# Patient Record
Sex: Female | Born: 1950 | Race: White | Hispanic: No | Marital: Married | Smoking: Former smoker
Health system: Southern US, Community
[De-identification: ages and names within clinical notes are randomized; demographics above are authoritative.]

## PROBLEM LIST (undated history)

## (undated) DIAGNOSIS — B1910 Unspecified viral hepatitis B without hepatic coma: Secondary | ICD-10-CM

## (undated) DIAGNOSIS — H019 Unspecified inflammation of eyelid: Secondary | ICD-10-CM

## (undated) DIAGNOSIS — Z8619 Personal history of other infectious and parasitic diseases: Secondary | ICD-10-CM

## (undated) DIAGNOSIS — E079 Disorder of thyroid, unspecified: Secondary | ICD-10-CM

## (undated) DIAGNOSIS — Z86010 Personal history of colonic polyps: Secondary | ICD-10-CM

## (undated) HISTORY — PX: BREAST BIOPSY: SHX20

## (undated) HISTORY — DX: Personal history of colonic polyps: Z86.010

## (undated) HISTORY — PX: BREAST SURGERY: SHX581

## (undated) HISTORY — PX: TONSILLECTOMY AND ADENOIDECTOMY: SHX28

## (undated) HISTORY — DX: Unspecified viral hepatitis B without hepatic coma: B19.10

## (undated) HISTORY — DX: Unspecified inflammation of eyelid: H01.9

## (undated) HISTORY — PX: COLONOSCOPY: SHX174

## (undated) HISTORY — PX: CHOLECYSTECTOMY: SHX55

## (undated) HISTORY — DX: Personal history of other infectious and parasitic diseases: Z86.19

## (undated) HISTORY — DX: Disorder of thyroid, unspecified: E07.9

---

## 2001-11-13 ENCOUNTER — Other Ambulatory Visit: Admission: RE | Admit: 2001-11-13 | Discharge: 2001-11-13 | Payer: Self-pay | Admitting: Obstetrics and Gynecology

## 2002-01-15 ENCOUNTER — Encounter: Payer: Self-pay | Admitting: Surgery

## 2002-01-15 ENCOUNTER — Encounter: Admission: RE | Admit: 2002-01-15 | Discharge: 2002-01-15 | Payer: Self-pay | Admitting: Surgery

## 2002-10-08 ENCOUNTER — Encounter: Payer: Self-pay | Admitting: Internal Medicine

## 2002-11-12 ENCOUNTER — Encounter: Payer: Self-pay | Admitting: Surgery

## 2002-11-17 ENCOUNTER — Encounter: Payer: Self-pay | Admitting: Surgery

## 2002-11-17 ENCOUNTER — Ambulatory Visit (HOSPITAL_COMMUNITY): Admission: RE | Admit: 2002-11-17 | Discharge: 2002-11-17 | Payer: Self-pay | Admitting: Surgery

## 2002-11-17 ENCOUNTER — Encounter (INDEPENDENT_AMBULATORY_CARE_PROVIDER_SITE_OTHER): Payer: Self-pay

## 2002-12-24 ENCOUNTER — Other Ambulatory Visit: Admission: RE | Admit: 2002-12-24 | Discharge: 2002-12-24 | Payer: Self-pay | Admitting: Obstetrics and Gynecology

## 2003-04-29 ENCOUNTER — Ambulatory Visit (HOSPITAL_COMMUNITY): Admission: RE | Admit: 2003-04-29 | Discharge: 2003-04-29 | Payer: Self-pay | Admitting: Obstetrics and Gynecology

## 2003-04-29 ENCOUNTER — Encounter (INDEPENDENT_AMBULATORY_CARE_PROVIDER_SITE_OTHER): Payer: Self-pay

## 2003-10-14 ENCOUNTER — Other Ambulatory Visit: Admission: RE | Admit: 2003-10-14 | Discharge: 2003-10-14 | Payer: Self-pay | Admitting: Obstetrics and Gynecology

## 2003-12-12 LAB — HM COLONOSCOPY: HM Colonoscopy: NORMAL

## 2004-04-13 ENCOUNTER — Other Ambulatory Visit: Admission: RE | Admit: 2004-04-13 | Discharge: 2004-04-13 | Payer: Self-pay | Admitting: Obstetrics and Gynecology

## 2004-07-20 ENCOUNTER — Other Ambulatory Visit: Admission: RE | Admit: 2004-07-20 | Discharge: 2004-07-20 | Payer: Self-pay | Admitting: Obstetrics and Gynecology

## 2005-02-01 ENCOUNTER — Other Ambulatory Visit: Admission: RE | Admit: 2005-02-01 | Discharge: 2005-02-01 | Payer: Self-pay | Admitting: Obstetrics and Gynecology

## 2005-06-21 ENCOUNTER — Ambulatory Visit: Payer: Self-pay | Admitting: Internal Medicine

## 2005-06-28 ENCOUNTER — Ambulatory Visit: Payer: Self-pay | Admitting: Internal Medicine

## 2005-07-12 ENCOUNTER — Ambulatory Visit: Payer: Self-pay | Admitting: Internal Medicine

## 2005-07-19 ENCOUNTER — Encounter: Payer: Self-pay | Admitting: Internal Medicine

## 2005-07-19 ENCOUNTER — Ambulatory Visit: Payer: Self-pay | Admitting: Internal Medicine

## 2005-07-26 ENCOUNTER — Other Ambulatory Visit: Admission: RE | Admit: 2005-07-26 | Discharge: 2005-07-26 | Payer: Self-pay | Admitting: Obstetrics and Gynecology

## 2006-01-31 ENCOUNTER — Other Ambulatory Visit: Admission: RE | Admit: 2006-01-31 | Discharge: 2006-01-31 | Payer: Self-pay | Admitting: Obstetrics and Gynecology

## 2007-09-11 LAB — CONVERTED CEMR LAB: Pap Smear: NORMAL

## 2008-04-09 ENCOUNTER — Ambulatory Visit: Payer: Self-pay | Admitting: Internal Medicine

## 2008-04-09 DIAGNOSIS — I8390 Asymptomatic varicose veins of unspecified lower extremity: Secondary | ICD-10-CM

## 2008-04-09 DIAGNOSIS — R030 Elevated blood-pressure reading, without diagnosis of hypertension: Secondary | ICD-10-CM | POA: Insufficient documentation

## 2008-04-22 ENCOUNTER — Encounter: Payer: Self-pay | Admitting: Internal Medicine

## 2008-04-22 ENCOUNTER — Encounter: Admission: RE | Admit: 2008-04-22 | Discharge: 2008-04-22 | Payer: Self-pay | Admitting: Internal Medicine

## 2008-05-06 ENCOUNTER — Encounter: Payer: Self-pay | Admitting: Internal Medicine

## 2008-07-15 ENCOUNTER — Ambulatory Visit: Payer: Self-pay | Admitting: Internal Medicine

## 2008-07-15 LAB — CONVERTED CEMR LAB
ALT: 46 units/L — ABNORMAL HIGH (ref 0–35)
AST: 34 units/L (ref 0–37)
Alkaline Phosphatase: 93 units/L (ref 39–117)
Basophils Relative: 1.2 % (ref 0.0–3.0)
Bilirubin Urine: NEGATIVE
CO2: 29 meq/L (ref 19–32)
Chloride: 102 meq/L (ref 96–112)
Creatinine, Ser: 0.8 mg/dL (ref 0.4–1.2)
Direct LDL: 157.1 mg/dL
Eosinophils Relative: 2.8 % (ref 0.0–5.0)
Glucose, Urine, Semiquant: NEGATIVE
HDL: 60.3 mg/dL (ref >39.0)
MCHC: 34.2 g/dL (ref 30.0–36.0)
MCV: 91.2 fL (ref 78.0–100.0)
Monocytes Relative: 9.5 % (ref 3.0–12.0)
Platelets: 238 10*3/uL (ref 150–400)
Potassium: 4.1 meq/L (ref 3.5–5.1)
RDW: 12.1 % (ref 11.5–14.6)
Sodium: 140 meq/L (ref 135–145)
TSH: 2.93 microintl units/mL (ref 0.35–5.50)
Total Bilirubin: 1 mg/dL (ref 0.3–1.2)
Total CHOL/HDL Ratio: 3.7
Urobilinogen, UA: 0.2

## 2008-07-29 ENCOUNTER — Ambulatory Visit: Payer: Self-pay | Admitting: Internal Medicine

## 2008-08-12 ENCOUNTER — Encounter: Payer: Self-pay | Admitting: Internal Medicine

## 2008-08-12 ENCOUNTER — Ambulatory Visit: Payer: Self-pay | Admitting: Family Medicine

## 2008-09-10 LAB — HM MAMMOGRAPHY

## 2008-12-11 DIAGNOSIS — E079 Disorder of thyroid, unspecified: Secondary | ICD-10-CM

## 2008-12-11 HISTORY — DX: Disorder of thyroid, unspecified: E07.9

## 2009-10-11 LAB — CONVERTED CEMR LAB: Pap Smear: NORMAL

## 2009-12-17 ENCOUNTER — Telehealth: Payer: Self-pay | Admitting: Internal Medicine

## 2009-12-22 ENCOUNTER — Ambulatory Visit: Payer: Self-pay | Admitting: Internal Medicine

## 2009-12-22 LAB — CONVERTED CEMR LAB
BUN: 9 mg/dL (ref 6–23)
Basophils Absolute: 0.1 10*3/uL (ref 0.0–0.1)
Basophils Relative: 0.9 % (ref 0.0–3.0)
Bilirubin Urine: NEGATIVE
CO2: 29 meq/L (ref 19–32)
Chloride: 108 meq/L (ref 96–112)
Cholesterol: 211 mg/dL — ABNORMAL HIGH (ref 0–200)
Creatinine, Ser: 0.8 mg/dL (ref 0.4–1.2)
Direct LDL: 124 mg/dL
Eosinophils Absolute: 0.1 10*3/uL (ref 0.0–0.7)
Eosinophils Relative: 2 % (ref 0.0–5.0)
Glucose, Bld: 109 mg/dL — ABNORMAL HIGH (ref 70–99)
Glucose, Urine, Semiquant: NEGATIVE
HCT: 45.3 % (ref 36.0–46.0)
Ketones, urine, test strip: NEGATIVE
Lymphocytes Relative: 19.7 % (ref 12.0–46.0)
Lymphs Abs: 1.2 10*3/uL (ref 0.7–4.0)
MCV: 93 fL (ref 78.0–100.0)
Neutrophils Relative %: 70.1 % (ref 43.0–77.0)
Nitrite: NEGATIVE
Platelets: 246 10*3/uL (ref 150.0–400.0)
Protein, U semiquant: NEGATIVE
RBC: 4.87 M/uL (ref 3.87–5.11)
Total Bilirubin: 0.8 mg/dL (ref 0.3–1.2)
Total CHOL/HDL Ratio: 3
VLDL: 15.2 mg/dL (ref 0.0–40.0)
Vit D, 25-Hydroxy: 33 ng/mL (ref 30–89)
WBC: 5.9 10*3/uL (ref 4.5–10.5)

## 2010-01-13 ENCOUNTER — Ambulatory Visit: Payer: Self-pay | Admitting: Internal Medicine

## 2010-02-02 ENCOUNTER — Encounter: Admission: RE | Admit: 2010-02-02 | Discharge: 2010-02-02 | Payer: Self-pay | Admitting: Internal Medicine

## 2010-02-16 ENCOUNTER — Encounter: Admission: RE | Admit: 2010-02-16 | Discharge: 2010-02-16 | Payer: Self-pay | Admitting: Diagnostic Radiology

## 2010-02-22 ENCOUNTER — Encounter: Admission: RE | Admit: 2010-02-22 | Discharge: 2010-02-22 | Payer: Self-pay | Admitting: Interventional Radiology

## 2010-03-29 ENCOUNTER — Encounter: Admission: RE | Admit: 2010-03-29 | Discharge: 2010-03-29 | Payer: Self-pay | Admitting: Diagnostic Radiology

## 2010-04-20 ENCOUNTER — Encounter: Admission: RE | Admit: 2010-04-20 | Discharge: 2010-04-20 | Payer: Self-pay | Admitting: Diagnostic Radiology

## 2010-07-01 ENCOUNTER — Encounter: Admission: RE | Admit: 2010-07-01 | Discharge: 2010-07-01 | Payer: Self-pay | Admitting: Internal Medicine

## 2010-07-01 ENCOUNTER — Encounter: Payer: Self-pay | Admitting: Internal Medicine

## 2010-07-11 ENCOUNTER — Encounter: Payer: Self-pay | Admitting: Internal Medicine

## 2010-07-15 ENCOUNTER — Encounter: Admission: RE | Admit: 2010-07-15 | Discharge: 2010-07-15 | Payer: Self-pay | Admitting: Internal Medicine

## 2010-07-15 ENCOUNTER — Encounter: Payer: Self-pay | Admitting: Internal Medicine

## 2010-07-28 ENCOUNTER — Ambulatory Visit: Payer: Self-pay | Admitting: Internal Medicine

## 2010-07-28 DIAGNOSIS — D487 Neoplasm of uncertain behavior of other specified sites: Secondary | ICD-10-CM

## 2010-08-06 IMAGING — US US MFM FOLLOW-UP FOCUS VISIT
1 series · 13 of 28 positions shown · non-contrast
Comparison: none

[Series 1: us mfm follow-up focus visit · 13 of 37 slices shown]
[im 2/37]
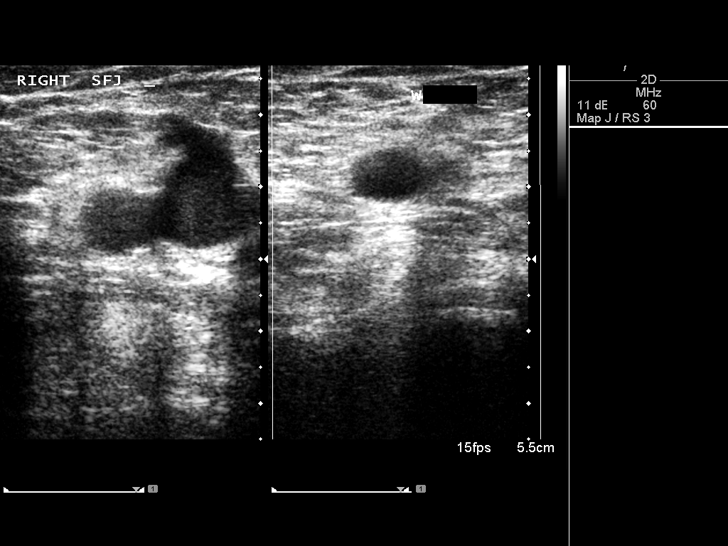
[im 5/37]
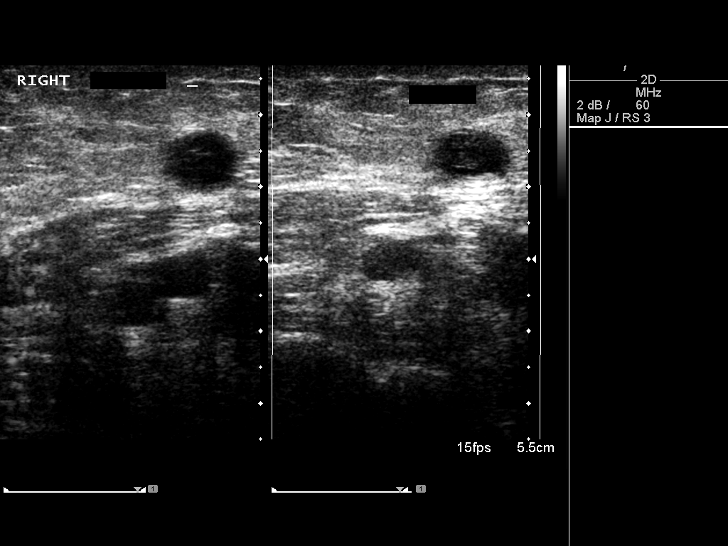
[im 7/37]
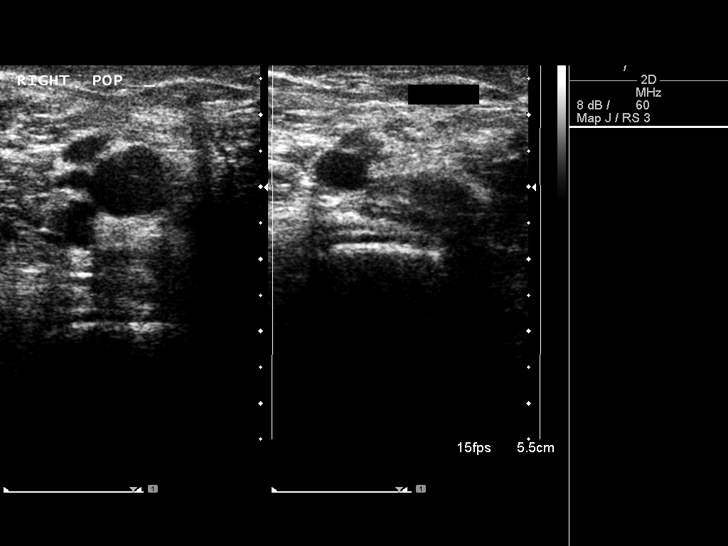
[im 10/37]
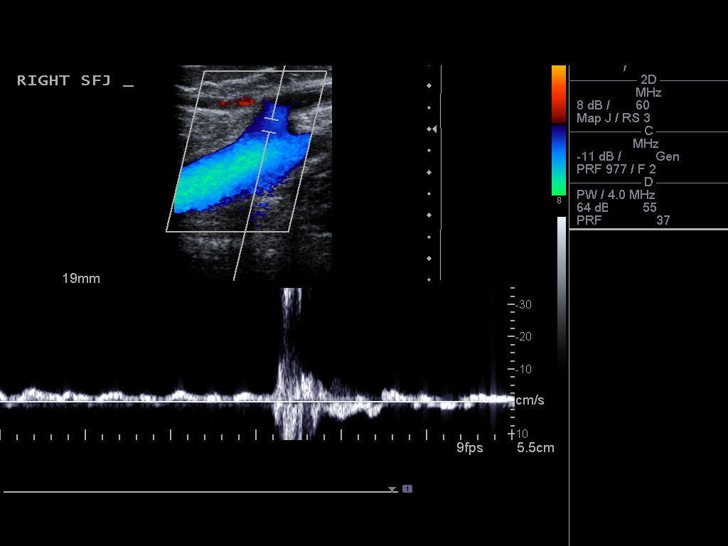
[im 13/37]
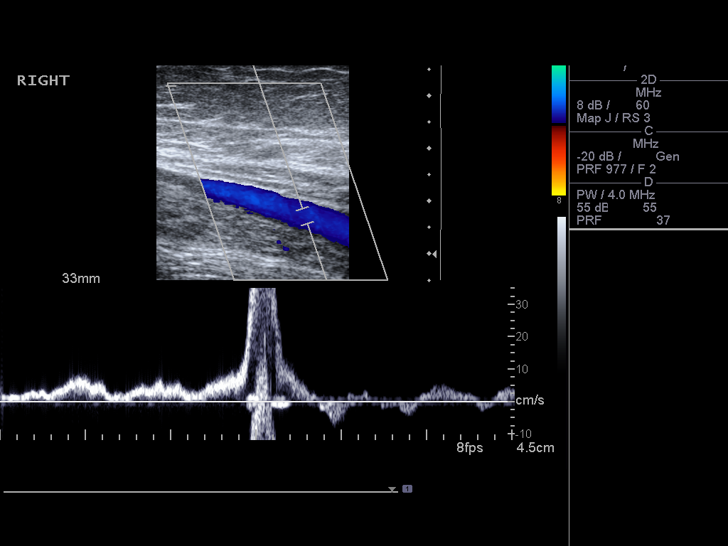
[im 15/37]
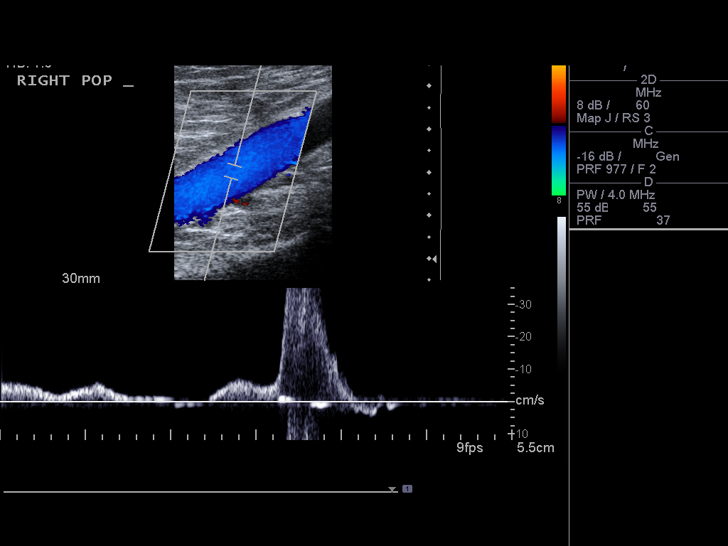
[im 19/37]
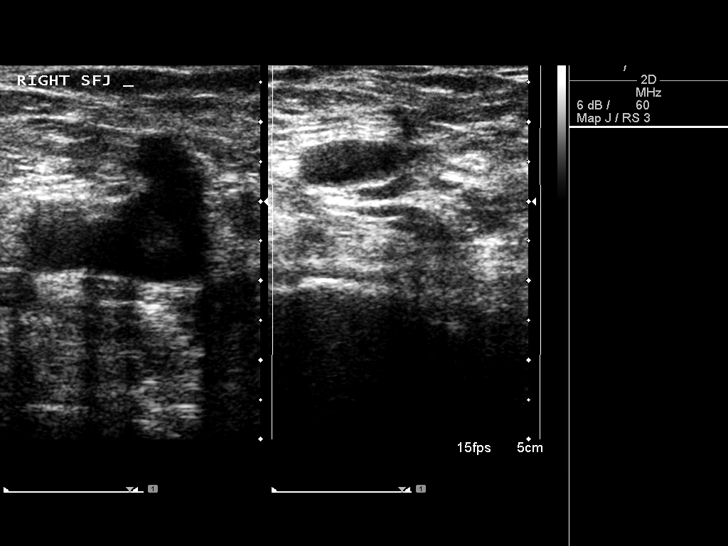
[im 22/37]
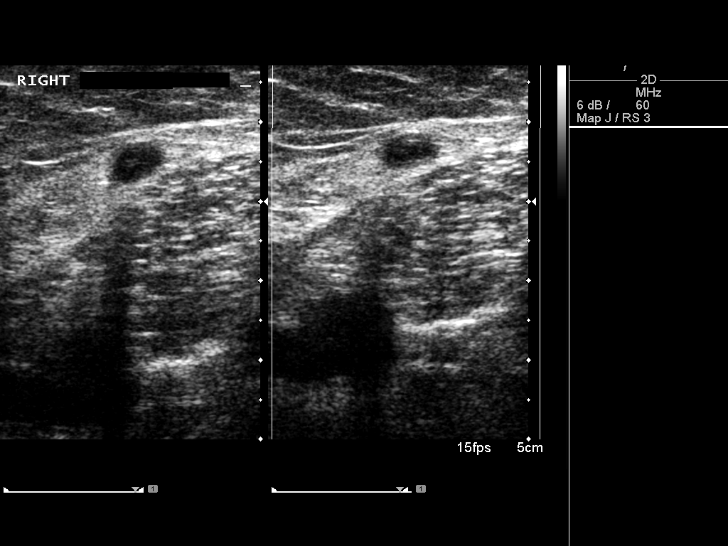
[im 25/37]
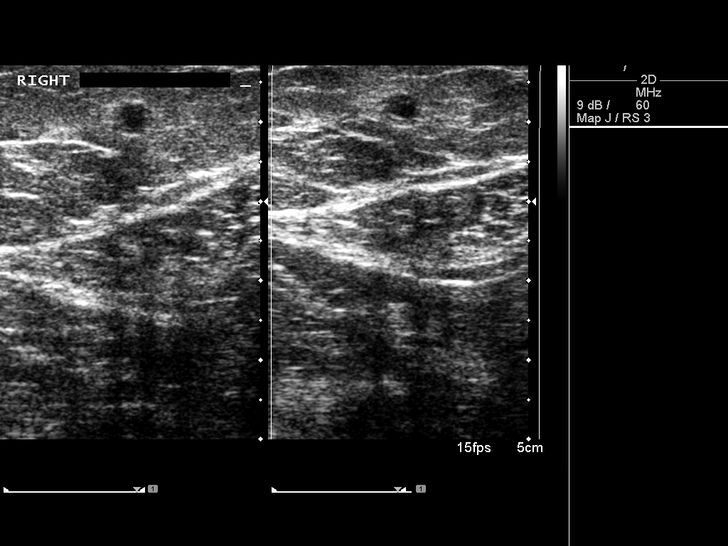
[im 27/37]
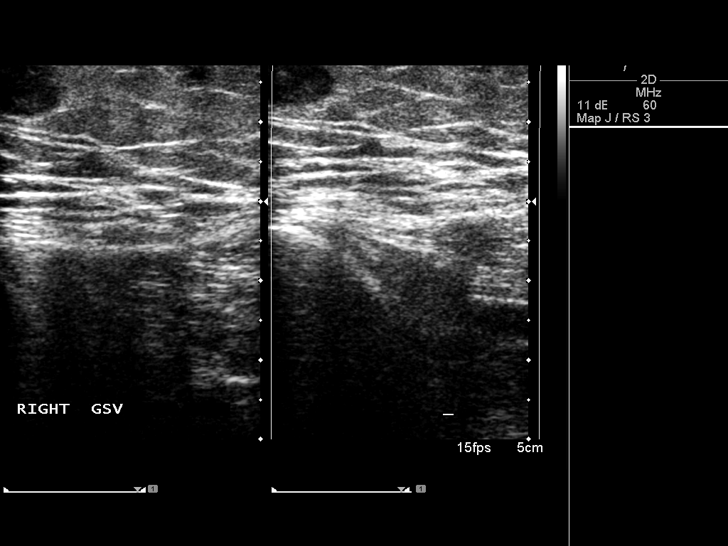
[im 30/37]
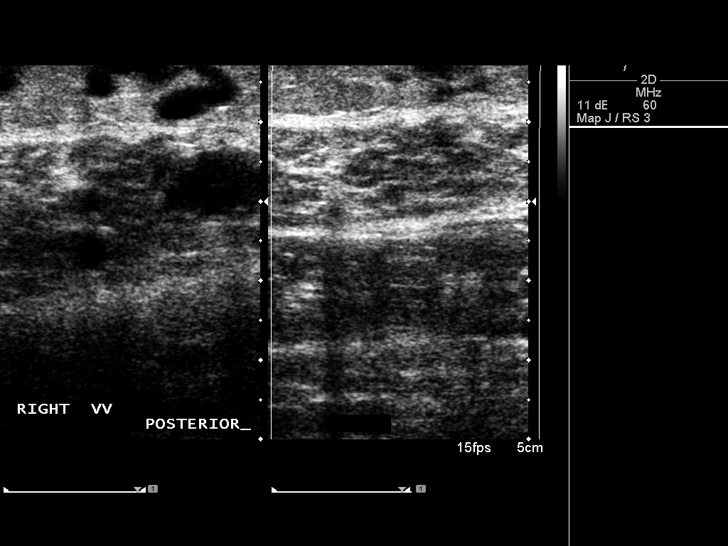
[im 33/37]
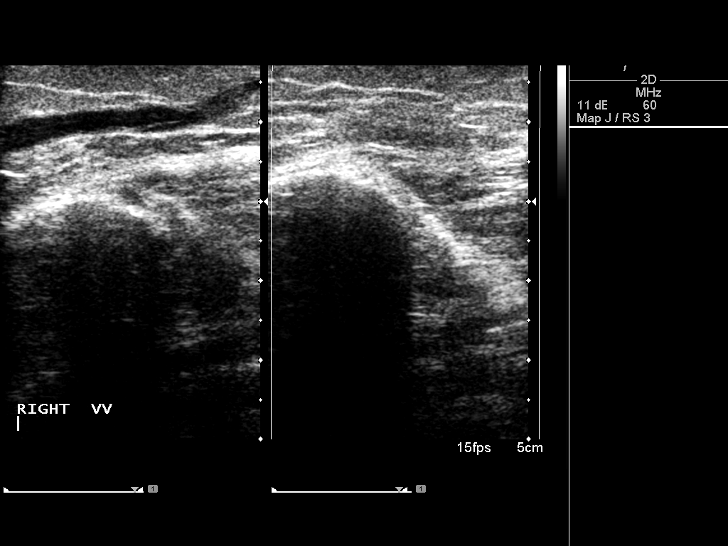
[im 35/37]
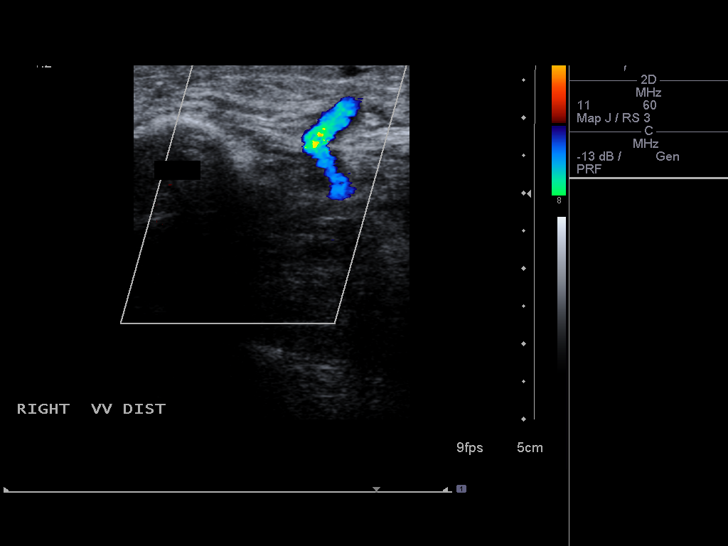

[13 of 28 positions shown; findings below may reference images not displayed]

ESTABLISHED OUTPATIENT VISIT:  22969 Level 2

Reason for visit: 1-month follow-up after right GSV laser treatment

History of Present Illness: 58-year-old white female with right
lower extremity superficial venous insufficiency.  The patient
underwent endovascular laser treatment to the right great saphenous
vein on 02/22/2010 along with sclerotherapy in the calf.  The
patient has done very well following the treatment.  The pain in
the right lower extremity has resolved.  No evidence for
superficial thrombophlebitis.  She has been wearing her compression
stockings regularly.

Physical Exam: The puncture site in the lower right thigh is well
healed.  There are a few "rope-like" structures in the right calf
consistent with thrombosed varicosities.  No evidence for erythema
or skin breakdown.  Multiple telangiectasias throughout the calf.

Imaging: No evidence for right lower extremity DVT.  The treated
segment of the right great saphenous vein is occluded.  Multiple
varicosities in the right calf are thrombosed.  A few small
varicosities are remaining along the medial anterior shin and there
are a few partially thrombosed varicosities in the posterior calf.

Assessment: The patient has done very well following endovascular
laser therapy treatment to the right lower extremity.  There are
few remaining varicose veins related to perforator disease that are
amendable to sclerotherapy.

Plan: The patient underwent ultrasound-guided foam sclerotherapy to
residual varicose veins along the anterior medial shin.  The
patient tolerated the procedure well.  No immediate complication.
The patient will return in 1 month.  The patient will continue to
wear the compression stockings regularly.

## 2010-08-11 ENCOUNTER — Encounter: Payer: Self-pay | Admitting: Internal Medicine

## 2010-08-11 ENCOUNTER — Other Ambulatory Visit: Admission: RE | Admit: 2010-08-11 | Discharge: 2010-08-11 | Payer: Self-pay | Admitting: Otolaryngology

## 2010-08-24 ENCOUNTER — Encounter: Admission: RE | Admit: 2010-08-24 | Discharge: 2010-08-24 | Payer: Self-pay | Admitting: Diagnostic Radiology

## 2010-09-13 ENCOUNTER — Encounter: Payer: Self-pay | Admitting: Internal Medicine

## 2010-09-26 ENCOUNTER — Ambulatory Visit (HOSPITAL_BASED_OUTPATIENT_CLINIC_OR_DEPARTMENT_OTHER): Admission: RE | Admit: 2010-09-26 | Discharge: 2010-09-27 | Payer: Self-pay | Admitting: Otolaryngology

## 2010-10-05 ENCOUNTER — Encounter: Admission: RE | Admit: 2010-10-05 | Discharge: 2010-10-05 | Payer: Self-pay | Admitting: Diagnostic Radiology

## 2010-10-07 ENCOUNTER — Encounter: Payer: Self-pay | Admitting: Internal Medicine

## 2010-11-09 ENCOUNTER — Encounter: Payer: Self-pay | Admitting: Internal Medicine

## 2010-12-11 LAB — HM MAMMOGRAPHY: HM Mammogram: NORMAL

## 2011-01-10 NOTE — Letter (Signed)
Summary: [facility]  [facility]   Imported By: Maryln Gottron 10/21/2010 15:52:10  _____________________________________________________________________  External Attachment:    Type:   Image     Comment:   External Document

## 2011-01-10 NOTE — Progress Notes (Signed)
Summary: lab order  Phone Note Call from Patient Call back at Home Phone [phone]   Caller: Patient Call For: Andrea Sons MD Summary of Call: PT WOULD LIKE TO HAVE A LAB ORDER FOR  CPX LAB WITH VIT B LEVEL INCLUDED FAX TO HER WORK # [phone] ATTN [name].  Pt  is sch for cpx 01-14-2010 Initial call taken by: Heron Sabins,  December 17, 2009 10:46 AM  Follow-up for Phone Call        ok check vit D, not vit b Follow-up by: Andrea Sons MD,  December 17, 2009 4:36 PM  Additional Follow-up for Phone Call Additional follow up Details #1::        need dx code for vit d level please Additional Follow-up by: Heron Sabins,  December 17, 2009 4:57 PM

## 2011-01-10 NOTE — Assessment & Plan Note (Signed)
Summary: CPX/NJR   Vital Signs:  Patient profile:   60 year old female Menstrual status:  postmenopausal Height:      63 inches Weight:      164 pounds BMI:     29.16 Pulse rate:   68 / minute Resp:     12 per minute BP sitting:   164 / 90  (left arm)  Vitals Entered By: Gladis Riffle, RN (January 13, 2010 8:58 AM)  Nutrition Counseling: Patient's BMI is greater than 25 and therefore counseled on weight management options.  Serial Vital Signs/Assessments:  Time      Position  BP       Pulse  Resp  Temp     By                     135/84                         Birdie Sons MD  CC: cpx, labs done; has gyn Is Patient Diabetic? No Comments discuss right leg     Menstrual Status postmenopausal Last PAP Result normal-pt's report   CC:  cpx and labs done; has gyn.  History of Present Illness: cpx   having evaluated varicose veins:pain and swelling  Preventive Screening-Counseling & Management  Alcohol-Tobacco     Smoking Status: quit  Current Problems (verified): 1)  Preventive Health Care  (ICD-V70.0) 2)  Elevated Blood Pressure Without Diagnosis of Hypertension  (ICD-796.2) 3)  Varicose Veins, Lower Extremities  (ICD-454.9) 4)  Family History Diabetes 1st Degree Relative  (ICD-V18.0)  Current Medications (verified): 1)  Caltrate 600+d 600-400 Mg-Unit Tabs (Calcium Carbonate-Vitamin D) .... Two Once Daily  Allergies: 1)  ! Pcn  Past History:  Past Medical History: Last updated: 09/02/2007 Unremarkable  Past Surgical History: Last updated: 09/02/2007 Cholecystectomy Colonoscopy  Family History: Last updated: Aug 21, 2008 FAther- CHF-deceased Mother COPD-deceased  Family History Diabetes 1st degree relative-sister one siser with congenital heart defect  Social History: Last updated: Aug 21, 2008 Occupation: lab tech Married Former Smoker Alcohol use-yes Regular exercise-yes-walking  Risk Factors: Exercise: yes (09/02/2007)  Risk  Factors: Smoking Status: quit (01/13/2010)  Review of Systems       All other systems reviewed and were negative    Impression & Recommendations:  Problem # 1:  PREVENTIVE HEALTH CARE (ICD-V70.0) weight loss encouraged daily exercise note moderate BP and blood sugar---weight loss will help both  Problem # 2:  VARICOSE VEINS, LOWER EXTREMITIES (ICD-454.9) she should have corrected.  Orders: Radiology Referral (Radiology)  Complete Medication List: 1)  Caltrate 600+d 600-400 Mg-unit Tabs (Calcium carbonate-vitamin d) .... Two once daily  Preventive Care Screening  Mammogram:    Date:  10/11/2009    Next Due:  10/2011    Results:  normal-pt's report   Pap Smear:    Date:  10/11/2009    Next Due:  10/2012    Results:  normal-pt's report   Last Tetanus Booster:    Date:  08/21/2008    Results:  Td    Patient Instructions: 1)  Please schedule a follow-up appointment in 6 months.   Immunization History:  Tetanus/Td Immunization History:    Tetanus/Td:  td (2008/08/21)  Influenza Immunization History:    Influenza:  historical (10/20/2009)  Zostavax History:    Zostavax # 1:  zostavax (11/17/2009)  Physical Exam General Appearance: well developed, well nourished, no acute distress Eyes: conjunctiva and lids normal, PERRL, EOMI, Ears, Nose,  Mouth, Throat: TM clear, nares clear, oral exam WNL Neck: supple, no lymphadenopathy, no thyromegaly, no JVD Respiratory: clear to auscultation and percussion, respiratory effort normal Cardiovascular: regular rate and rhythm, S1-S2, no murmur, rub or gallop, no bruits, peripheral pulses normal and symmetric, no cyanosis, clubbing, edema or varicosities Chest: no scars, masses, tenderness; no asymmetry, skin changes, nipple discharge   Gastrointestinal: soft, non-tender; no hepatosplenomegaly, masses; active bowel sounds all quadrants,  Lymphatic: no cervical, axillary or inguinal adenopathy Musculoskeletal: gait normal,  muscle tone and strength WNL, no joint swelling, effusions, discoloration, crepitus  Skin: clear, good turgor, color WNL, no rashes, lesions, or ulcerations Neurologic: normal mental status, normal reflexes, normal strength, sensation, and motion Psychiatric: alert; oriented to person, place and time Other Exam:

## 2011-01-10 NOTE — Consult Note (Signed)
Summary: [facility]  [facility]   Imported By: Maryln Gottron 09/26/2010 09:13:53  _____________________________________________________________________  External Attachment:    Type:   Image     Comment:   External Document

## 2011-01-10 NOTE — Assessment & Plan Note (Signed)
Summary: EVAL OF KNOT ON NECK // RS   Vital Signs:  Patient profile:   60 year old female Menstrual status:  postmenopausal Weight:      160 pounds BMI:     28.45 Temp:     98.4 degrees F oral Pulse rate:   68 / minute Pulse rhythm:   regular Resp:     12 per minute BP sitting:   140 / 82  (left arm) Cuff size:   regular  Vitals Entered By: Gladis Riffle, RN (July 28, 2010 9:16 AM) CC: discuss knot on left side of neck--has results of [place] and CT plus lab Is Patient Diabetic? No   CC:  discuss knot on left side of neck--has results of [place] and CT plus lab.  History of Present Illness: has been evaluated for neck mass by dr Thea Silversmith she palpated mass 6-8 weeks ago-non tender / mobile has had ultrasound and CT---reviewed imaging while patient in office no fever, chills, no weight loss she does not recall any recent infection  All other systems reviewed and were negative   Preventive Screening-Counseling & Management  Alcohol-Tobacco     Smoking Status: quit  Current Medications (verified): 1)  Caltrate 600+d 600-400 Mg-Unit Tabs (Calcium Carbonate-Vitamin D) .... Two Once Daily 2)  Zyrtec Allergy 10 Mg Tabs (Cetirizine Hcl) .... As Needed  Allergies: 1)  ! Pcn  Past History:  Past Medical History: Last updated: 09/02/2007 Unremarkable  Past Surgical History: Last updated: 09/02/2007 Cholecystectomy Colonoscopy  Family History: Last updated: 08/24/08 FAther- CHF-deceased Mother COPD-deceased  Family History Diabetes 1st degree relative-sister one siser with congenital heart defect  Social History: Last updated: 24-Aug-2008 Occupation: lab tech Married Former Smoker Alcohol use-yes Regular exercise-yes-walking  Risk Factors: Exercise: yes (09/02/2007)  Risk Factors: Smoking Status: quit (07/28/2010)  Physical Exam  General:  alert and well-developed.   Head:  normocephalic and atraumatic.   Eyes:  pupils equal and pupils round.   Ears:  R ear  normal and L ear normal.   Neck:  No deformities, masses, or tenderness noted. Chest Wall:  No deformities, masses, or tenderness noted. Lungs:  Normal respiratory effort, chest expands symmetrically. Lungs are clear to auscultation, no crackles or wheezes. Abdomen:  Bowel sounds positive,abdomen soft and non-tender without masses, organomegaly or hernias noted. Cervical Nodes:  no anterior cervical adenopathy and no posterior cervical adenopathy.   palpab le left submandibular , hard, mobile lymph node---1cm   Impression & Recommendations:  Problem # 1:  NEOPLASM UNCERTAIN BEHAVIOR OTHER SPEC SITES (ICD-238.8) I suspect palpable benign lymph node, but needs eval refer ENT , suspect needs bx/removal of lymph node Orders: ENT Referral (ENT)  Complete Medication List: 1)  Caltrate 600+d 600-400 Mg-unit Tabs (Calcium carbonate-vitamin d) .... Two once daily 2)  Zyrtec Allergy 10 Mg Tabs (Cetirizine hcl) .... As needed

## 2011-01-10 NOTE — Consult Note (Signed)
Summary: [facility]  [facility]   Imported By: Sherian Rein 08/19/2010 12:21:30  _____________________________________________________________________  External Attachment:    Type:   Image     Comment:   External Document

## 2011-01-12 NOTE — Letter (Signed)
Summary: [facility]  [facility]   Imported By: Maryln Gottron 11/24/2010 12:14:19  _____________________________________________________________________  External Attachment:    Type:   Image     Comment:   External Document

## 2011-02-22 LAB — POCT HEMOGLOBIN-HEMACUE: Hemoglobin: 15.2 g/dL — ABNORMAL HIGH (ref 12.0–15.0)

## 2011-04-12 ENCOUNTER — Other Ambulatory Visit (INDEPENDENT_AMBULATORY_CARE_PROVIDER_SITE_OTHER): Payer: BC Managed Care – PPO | Admitting: Internal Medicine

## 2011-04-12 DIAGNOSIS — Z0000 Encounter for general adult medical examination without abnormal findings: Secondary | ICD-10-CM

## 2011-04-12 LAB — HEPATIC FUNCTION PANEL
ALT: 24 U/L (ref 0–35)
AST: 28 U/L (ref 0–37)
Alkaline Phosphatase: 97 U/L (ref 39–117)
Total Bilirubin: 0.5 mg/dL (ref 0.3–1.2)
Total Protein: 6.4 g/dL (ref 6.0–8.3)

## 2011-04-12 LAB — POCT URINALYSIS DIPSTICK
Blood, UA: NEGATIVE
Nitrite, UA: NEGATIVE
Protein, UA: NEGATIVE
Spec Grav, UA: 1.02
pH, UA: 6

## 2011-04-12 LAB — CBC WITH DIFFERENTIAL/PLATELET
Basophils Absolute: 0.1 10*3/uL (ref 0.0–0.1)
HCT: 42.6 % (ref 36.0–46.0)
Hemoglobin: 14.6 g/dL (ref 12.0–15.0)
MCHC: 34.2 g/dL (ref 30.0–36.0)
MCV: 91.3 fl (ref 78.0–100.0)
Neutrophils Relative %: 64.2 % (ref 43.0–77.0)
RBC: 4.67 Mil/uL (ref 3.87–5.11)
WBC: 5.4 10*3/uL (ref 4.5–10.5)

## 2011-04-12 LAB — BASIC METABOLIC PANEL
CO2: 26 mEq/L (ref 19–32)
Calcium: 9.1 mg/dL (ref 8.4–10.5)
Creatinine, Ser: 0.7 mg/dL (ref 0.4–1.2)
GFR: 90.24 mL/min (ref >60.00)

## 2011-04-12 LAB — LIPID PANEL
Cholesterol: 176 mg/dL (ref 0–200)
VLDL: 10.6 mg/dL (ref 0.0–40.0)

## 2011-04-19 ENCOUNTER — Encounter: Payer: Self-pay | Admitting: Internal Medicine

## 2011-04-20 ENCOUNTER — Encounter: Payer: Self-pay | Admitting: Internal Medicine

## 2011-04-20 ENCOUNTER — Ambulatory Visit (INDEPENDENT_AMBULATORY_CARE_PROVIDER_SITE_OTHER): Payer: BC Managed Care – PPO | Admitting: Internal Medicine

## 2011-04-20 VITALS — BP 128/74 | HR 68 | Temp 98.9°F | Ht 64.0 in | Wt 141.0 lb

## 2011-04-20 DIAGNOSIS — Z0000 Encounter for general adult medical examination without abnormal findings: Secondary | ICD-10-CM

## 2011-04-20 NOTE — Progress Notes (Signed)
  Subjective:    Patient ID: DEQUITA SCHLEICHER, female    DOB: [birth year], 60 y.o.   MRN: [id number]  HPI  cpx  No past medical history on file. Past Surgical History  Procedure Date  . Cholecystectomy     reports that she quit smoking about 30 years ago. Her smoking use included Cigarettes. She does not have any smokeless tobacco history on file. She reports that she drinks alcohol. She reports that she does not use illicit drugs. family history includes COPD in her mother; Diabetes in her sister; Heart disease in her sister; and Heart failure in her father. Allergies  Allergen Reactions  . Penicillins      Review of Systems  patient denies chest pain, shortness of breath, orthopnea. Denies lower extremity edema, abdominal pain, change in appetite, change in bowel movements. Patient denies rashes, musculoskeletal complaints. No other specific complaints in a complete review of systems.      Objective:   Physical Exam  Well-developed well-nourished female in no acute distress. HEENT exam atraumatic, normocephalic, extraocular muscles are intact. Neck is supple. No jugular venous distention no thyromegaly. Chest clear to auscultation without increased work of breathing. Cardiac exam S1 and S2 are regular. Abdominal exam active bowel sounds, soft, nontender. Extremities no edema. Neurologic exam she is alert without any motor sensory deficits. Gait is normal.        Assessment & Plan:  Well visit: Health maintenance is up-to-date. No further evaluation needed.

## 2011-04-28 NOTE — Op Note (Signed)
NAME:  Andrea Mccall, Andrea Mccall                       ACCOUNT NO.:  [id number]   MEDICAL RECORD NO.:  [id number]                   PATIENT TYPE:  AMB   LOCATION:  DAY                                  FACILITY:  [facility]   PHYSICIAN:  Currie Paris, M.D.           DATE OF BIRTH:  Jan 30, 1951   DATE OF PROCEDURE:  11/17/2002  DATE OF DISCHARGE:                                 OPERATIVE REPORT   CCS#:  [id number]   PREOPERATIVE DIAGNOSIS:  Chronic calculus cholecystitis.   POSTOPERATIVE DIAGNOSIS:  Chronic calculus cholecystitis.   OPERATION:  Laparoscopic cholecystectomy with operative cholangiogram.   SURGEON:  Currie Paris, M.D.   ASSISTANT:  Rose Phi. Maple Hudson, M.D.   ANESTHESIA:  General endotracheal   CLINICAL HISTORY:  This patient is a 60 year old lady with biliary symptoms  and gallstones. She elected to proceed with laparoscopic cholecystectomy.   DESCRIPTION OF PROCEDURE:  The patient was seen in the holding area and had  no further questions and we confirmed the plans for laparoscopic  cholecystectomy with cholangiogram.   She was taken to the operating room and after satisfactory general  endotracheal anesthesia had been obtained, the abdomen was prepped and  draped. The 0.25% plain Marcaine was instilled in the incisions and an  umbilical incision made first, fascia opened, peritoneal cavity entered. A  pursestring was placed and the Hasson introduced and the abdomen insufflated  to 15.   The gallbladder appeared grossly normal as did the liver. There were no  gross abnormalities noted within exam of the peritoneal cavity.   The patient was placed in reverse Trendelenburg and tilted to the left and  an additional cannula placed with a 10 mm in the epigastrium and two 5s  laterally. The gallbladder was retracted over the liver and the peritoneum  over the cystic duct opening and made a nice window here. I could see what  appeared to be the hepatic artery and tried  to stay away from that. I could  then dissect out through the cystic artery coming up and we had a fairly  long cystic duct. Once I had this window made and could see on both sides  and good visualization, I clipped the cystic duct right at its junction with  the gallbladder and opened it. Operative cholangiography was done which  showed a normal common duct with filling of the hepatic radicles and  duodenum and no filling defects.   Once that was done, I put two clips in the stay side of the cystic duct and  divided it. Both anterior branches and a posterior branch of the cystic  artery were dissected out, clipped and dividing leaving two clips in the  stay side. The gallbladder was removed from below to above. There was a  little bile leak from the cystic duct end of the gallbladder as that clip  came loose and so I put the gallbladder in the bile  bag which I had it  disconnected and brought it out.   The abdomen was irrigated. I made sure everything appeared to dry. I left a  little Surgicel on the bed of the gallbladder at one spot where the blood  had oozed earlier but had been dry at the end of the case. We got the  abdomen suctioned out completely.   The lateral ports removed and there was no bleeding from those. The  umbilical port was removed and the pursestring tied down. A final check was  made with a camera and everything appeared okay and the abdomen was then  deflated through the epigastric port which was removed.   The skin incisions were closed with 4-0 Monocryl subcuticular plus Steri-  Strips. The patient tolerated the procedure well. There were no operative  complications. All counts were correct.                                               Currie Paris, M.D.    CJS/MEDQ  D:  11/17/2002  T:  11/17/2002  Job:  [id number]   cc:   Valetta Mole. Swords, M.D. [facility]  [address] [place]  [place] [address]  Fax: 1   Randye Lobo, M.D.  [address]  [place]  [place]  [address]  Fax: [phone]

## 2011-04-28 NOTE — Op Note (Signed)
NAME:  Andrea Mccall, Andrea Mccall                       ACCOUNT NO.:  [id number]   MEDICAL RECORD NO.:  [id number]                   PATIENT TYPE:  AMB   LOCATION:  SDC                                  FACILITY:  [facility]   PHYSICIAN:  Randye Lobo, M.D.                DATE OF BIRTH:  [birth year]   DATE OF PROCEDURE:  04/29/2003  DATE OF DISCHARGE:                                 OPERATIVE REPORT   PREOPERATIVE DIAGNOSIS:  Cervical intraepithelial neoplasia I.   POSTOPERATIVE DIAGNOSIS:  Cervical intraepithelial neoplasia I.   PROCEDURE:  LEEP procedure.   SURGEON:  Conley Simmonds, M.D.   ANESTHESIA:  MAC, local with 0.5% lidocaine with 1:200,000 of epinephrine.   ESTIMATED BLOOD LOSS:  Minimal.   COMPLICATIONS:  None.   INDICATIONS FOR PROCEDURE:  The patient is a 59 year old Caucasian female  with a history of abnormal pap smears who presents for treatment.  The  patient initially had pap smear showing acute inflammation.  Follow up pap  smear evaluation documented CIN-I.  The patient went on to have colposcopy  documenting CIN-I.  The patient was noted to have a very large  transformation zone at the time of colposcopy.  Results of the evaluation  were discussed with the patient, and a decision was made to proceed with a  LEEP procedure after risks, benefits, and alternatives were discussed with  her.   FINDINGS:  Examination in the operating room documented no gross lesions of  the cervix.  With Lugol staining, there was a large transformation zone  appreciated.   SPECIMENS:  The exocervical specimen was removed in three segments due to  the large nature of the squamocolumnar junction.  The endocervical specimen  was also removed and was identified separately.   DESCRIPTION OF PROCEDURE:  With an IV in place, the patient was taken to the  operating room after she was properly identified.  The patient in the OR was  placed I in the dorsal lithotomy position and MAC anesthesia was  then  induced.  A speculum was placed inside the vagina and the cervix was  examined.  The cervix was first moistened with ascetic acid and then Lugol  solution was placed.  The cervix was circumferentially injected with 0.5%  lidocaine with 1:200,000 epinephrine.  The exocervical specimen was removed  in three separate portions with a cutting setting of 70 watts.  The middle  portion was removed first followed by the superior and then inferior portion  which were marked in appropriate orientation for sending to Pathology.  The  endocervical specimen was removed with the square-shaped loop.  The  traditional cowboy hat excision of the endocervix.  The edges of the  operative site were then coagulated with monopolar cautery.  Monsel solution  was placed, and the procedure was concluded.   There were no complications of the procedure.  All needle, instrument, and  sponge counts  were correct.                                               Randye Lobo, M.D.    BES/MEDQ  D:  04/29/2003  T:  04/29/2003  Job:  [id number]

## 2011-10-27 ENCOUNTER — Other Ambulatory Visit: Payer: Self-pay | Admitting: Obstetrics and Gynecology

## 2014-11-24 LAB — HM MAMMOGRAPHY

## 2015-01-21 ENCOUNTER — Ambulatory Visit (INDEPENDENT_AMBULATORY_CARE_PROVIDER_SITE_OTHER): Payer: Medicare HMO | Admitting: Family Medicine

## 2015-01-21 ENCOUNTER — Encounter: Payer: Self-pay | Admitting: Family Medicine

## 2015-01-21 VITALS — BP 130/70 | HR 80 | Temp 98.3°F | Ht 63.0 in | Wt 145.2 lb

## 2015-01-21 DIAGNOSIS — E7800 Pure hypercholesterolemia, unspecified: Secondary | ICD-10-CM | POA: Insufficient documentation

## 2015-01-21 DIAGNOSIS — L290 Pruritus ani: Secondary | ICD-10-CM | POA: Insufficient documentation

## 2015-01-21 DIAGNOSIS — L249 Irritant contact dermatitis, unspecified cause: Secondary | ICD-10-CM | POA: Insufficient documentation

## 2015-01-21 DIAGNOSIS — Z23 Encounter for immunization: Secondary | ICD-10-CM

## 2015-01-21 DIAGNOSIS — Z8619 Personal history of other infectious and parasitic diseases: Secondary | ICD-10-CM

## 2015-01-21 NOTE — Progress Notes (Signed)
Pre visit review using our clinic review tool, if applicable. No additional management support is needed unless otherwise documented below in the visit note. 

## 2015-01-21 NOTE — Patient Instructions (Addendum)
Call Dr. Carlean Purl to set up colonoscopy this year. Schedule CPX with labs prior in 11/2015. Call for appt if rectal itching returns.

## 2015-01-21 NOTE — Assessment & Plan Note (Signed)
Resolved with OTC topical steroid

## 2015-01-21 NOTE — Assessment & Plan Note (Signed)
Followed by Dr. Lamount Cohen. Started on methotrexate in last week. Prednisone has also improved.

## 2015-01-21 NOTE — Progress Notes (Signed)
   Subjective:    Patient ID: Andrea Mccall, female    DOB: Jun 11, 1951, 64 y.o.   MRN: [id number]  HPI   64 year old female presents to establish care.  She has been seeing Dr. Leanne Chang, last saw 4-5 years ago.  Last CPX 11/2014 [facility]  Dr. Rogue Bussing, Does not plan to return. nml mammo, last DEXA > 2 years ago, was nml.  She had cholesterol done at work  ( 12/16/2014 tot 255, LDL 148, HDL 85) LFTs nml.  1/13 repeated off eggs:  LDL 118, HDL 68  glucose 98, A1C 5.7  She has been working on healthy eating, decreasing eggs, weight loss and regular exercise (30 min  3-4 times a week)   Colonoscopy  nml Dr. Carlean Purl. 2006, repeat in 210 years. Due now.  In last few months she had a lot of rectal itching.  Tried OTC prep H, tucks did not help.  She used 1% hydrocortisone cream.. issue resolved.      Review of Systems  Constitutional: Negative for fever and fatigue.  HENT: Negative for ear pain.   Eyes: Negative for pain.  Respiratory: Negative for chest tightness and shortness of breath.   Cardiovascular: Negative for chest pain, palpitations and leg swelling.  Gastrointestinal: Negative for abdominal pain.  Genitourinary: Negative for dysuria.       Objective:   Physical Exam  Constitutional: Vital signs are normal. She appears well-developed and well-nourished. She is cooperative.  Non-toxic appearance. She does not appear ill. No distress.  HENT:  Head: Normocephalic.  Right Ear: Hearing, tympanic membrane, external ear and ear canal normal.  Left Ear: Hearing, tympanic membrane, external ear and ear canal normal.  Nose: Nose normal.  Eyes: Conjunctivae, EOM and lids are normal. Pupils are equal, round, and reactive to light. Lids are everted and swept, no foreign bodies found.  Neck: Trachea normal and normal range of motion. Neck supple. Carotid bruit is not present. No thyroid mass and no thyromegaly present.  Cardiovascular: Normal rate, regular rhythm, S1  normal, S2 normal, normal heart sounds and intact distal pulses.  Exam reveals no gallop.   No murmur heard. Pulmonary/Chest: Effort normal and breath sounds normal. No respiratory distress. She has no wheezes. She has no rhonchi. She has no rales.  Abdominal: Soft. Normal appearance and bowel sounds are normal. She exhibits no distension, no fluid wave, no abdominal bruit and no mass. There is no hepatosplenomegaly. There is no tenderness. There is no rebound, no guarding and no CVA tenderness. No hernia.  Lymphadenopathy:    She has no cervical adenopathy.    She has no axillary adenopathy.  Neurological: She is alert. She has normal strength. No cranial nerve deficit or sensory deficit.  Skin: Skin is warm, dry and intact. No rash noted.  Dry red skin around eyes, improving per pt on methotrexate and prednisone  Psychiatric: Her speech is normal and behavior is normal. Judgment normal. Her mood appears not anxious. Cognition and memory are normal. She does not exhibit a depressed mood.          Assessment & Plan:

## 2015-01-21 NOTE — Assessment & Plan Note (Signed)
Well controlled now on no med with diet hcanges.

## 2015-01-21 NOTE — Assessment & Plan Note (Signed)
Will research whether any eval other than following LFTs is needed.

## 2015-06-17 ENCOUNTER — Encounter: Payer: Self-pay | Admitting: Internal Medicine

## 2015-07-16 ENCOUNTER — Encounter: Payer: Self-pay | Admitting: *Deleted

## 2015-08-18 ENCOUNTER — Ambulatory Visit (AMBULATORY_SURGERY_CENTER): Payer: Self-pay

## 2015-08-18 VITALS — Ht 63.0 in | Wt 151.0 lb

## 2015-08-18 DIAGNOSIS — Z1211 Encounter for screening for malignant neoplasm of colon: Secondary | ICD-10-CM

## 2015-08-18 NOTE — Progress Notes (Signed)
No allergies  To eggs or soy No home oxygen No diet/weight loss meds No past problems with anesthesia  Has email  emmi instructions given for colonoscopy

## 2015-09-01 ENCOUNTER — Ambulatory Visit (AMBULATORY_SURGERY_CENTER): Payer: Medicare HMO | Admitting: Internal Medicine

## 2015-09-01 ENCOUNTER — Encounter: Payer: Self-pay | Admitting: Internal Medicine

## 2015-09-01 VITALS — BP 140/65 | HR 72 | Temp 97.8°F | Resp 18 | Ht 63.0 in | Wt 151.0 lb

## 2015-09-01 DIAGNOSIS — D123 Benign neoplasm of transverse colon: Secondary | ICD-10-CM | POA: Diagnosis not present

## 2015-09-01 DIAGNOSIS — Z1211 Encounter for screening for malignant neoplasm of colon: Secondary | ICD-10-CM

## 2015-09-01 MED ORDER — SODIUM CHLORIDE 0.9 % IV SOLN: 500.0000 mL | INTRAVENOUS | Status: DC

## 2015-09-01 NOTE — Progress Notes (Signed)
Called to room to assist during endoscopic procedure.  Patient ID and intended procedure confirmed with present staff. Received instructions for my participation in the procedure from the performing physician.  

## 2015-09-01 NOTE — Op Note (Signed)
[place]  [address]. [place], [address]   COLONOSCOPY PROCEDURE REPORT  PATIENT: Andrea Mccall, Andrea Mccall  MR#: [id number] BIRTHDATE: March 14, 1951 , 50  yrs. old GENDER: female ENDOSCOPIST: Gatha Mayer, MD, [facility] PROCEDURE DATE:  09/01/2015 PROCEDURE:   Colonoscopy, screening and Colonoscopy with snare polypectomy First Screening Colonoscopy - Avg.  risk and is 50 yrs.  old or older - No.  Prior Negative Screening - Now for repeat screening. 10 or more years since last screening  History of Adenoma - Now for follow-up colonoscopy & has been > or = to 3 yrs.  N/A  Polyps removed today? Yes ASA CLASS:   Class II INDICATIONS:Screening for colonic neoplasia and Colorectal Neoplasm Risk Assessment for this procedure is average risk. MEDICATIONS: Propofol 250 mg IV and Monitored anesthesia care  DESCRIPTION OF PROCEDURE:   After the risks benefits and alternatives of the procedure were thoroughly explained, informed consent was obtained.  The digital rectal exam revealed no abnormalities of the rectum.   The [id number]  endoscope was introduced through the anus and advanced to the cecum, which was identified by both the appendix and ileocecal valve. No adverse events experienced.   The quality of the prep was excellent. (MiraLax was used)  The instrument was then slowly withdrawn as the colon was fully examined. Estimated blood loss is zero unless otherwise noted in this procedure report.      COLON FINDINGS: A sessile polyp measuring 4 mm in size was found in the transverse colon.  A polypectomy was performed with a cold snare.  The resection was complete, the polyp tissue was completely retrieved and sent to histology.   Small external hemorrhoids were found.   The examination was otherwise normal.   Right colon retroflexion included.  Retroflexed views revealed external hemorrhoids. The time to cecum = 3.1 Withdrawal time = 8.7   The scope was  withdrawn and the procedure completed. COMPLICATIONS: There were no immediate complications.  ENDOSCOPIC IMPRESSION: 1.   Sessile polyp was found in the transverse colon; polypectomy was performed with a cold snare 2.   Small external hemorrhoids 3.   The examination was otherwise normal  RECOMMENDATIONS: Timing of repeat colonoscopy will be determined by pathology findings.  5-10 yrs likely  eSigned:  Gatha Mayer, MD, [facility] 09/01/2015 8:30 AM   cc: The Patient

## 2015-09-01 NOTE — Patient Instructions (Addendum)
I found and removed one small polyp that looks benign. Small external hemorrhoids - actually inside anal canal - were seen.  I will let you know pathology results and when to have another routine colonoscopy by mail.  I appreciate the opportunity to care for you. Gatha Mayer, MD, FACG      YOU HAD AN ENDOSCOPIC PROCEDURE TODAY AT [facility]:   Refer to the procedure report that was given to you for any specific questions about what was found during the examination.  If the procedure report does not answer your questions, please call your gastroenterologist to clarify.  If you requested that your care partner not be given the details of your procedure findings, then the procedure report has been included in a sealed envelope for you to review at your convenience later.  YOU SHOULD EXPECT: Some feelings of bloating in the abdomen. Passage of more gas than usual.  Walking can help get rid of the air that was put into your GI tract during the procedure and reduce the bloating. If you had a lower endoscopy (such as a colonoscopy or flexible sigmoidoscopy) you may notice spotting of blood in your stool or on the toilet paper. If you underwent a bowel prep for your procedure, you may not have a normal bowel movement for a few days.  Please Note:  You might notice some irritation and congestion in your nose or some drainage.  This is from the oxygen used during your procedure.  There is no need for concern and it should clear up in a day or so.  SYMPTOMS TO REPORT IMMEDIATELY:   Following lower endoscopy (colonoscopy or flexible sigmoidoscopy):  Excessive amounts of blood in the stool  Significant tenderness or worsening of abdominal pains  Swelling of the abdomen that is new, acute  Fever of 100F or higher    For urgent or emergent issues, a gastroenterologist can be reached at any hour by calling [phone].   DIET: Your first meal following the procedure  should be a small meal and then it is ok to progress to your normal diet. Heavy or fried foods are harder to digest and may make you feel nauseous or bloated.  Likewise, meals heavy in dairy and vegetables can increase bloating.  Drink plenty of fluids but you should avoid alcoholic beverages for 24 hours.  ACTIVITY:  You should plan to take it easy for the rest of today and you should NOT DRIVE or use heavy machinery until tomorrow (because of the sedation medicines used during the test).    FOLLOW UP: Our staff will call the number listed on your records the next business day following your procedure to check on you and address any questions or concerns that you may have regarding the information given to you following your procedure. If we do not reach you, we will leave a message.  However, if you are feeling well and you are not experiencing any problems, there is no need to return our call.  We will assume that you have returned to your regular daily activities without incident.  If any biopsies were taken you will be contacted by phone or by letter within the next 1-3 weeks.  Please call us at [phone] if you have not heard about the biopsies in 3 weeks.    SIGNATURES/CONFIDENTIALITY: You and/or your care partner have signed paperwork which will be entered into your electronic medical record.  These signatures attest to  the fact that that the information above on your After Visit Summary has been reviewed and is understood.  Full responsibility of the confidentiality of this discharge information lies with you and/or your care-partner.   Resume medications. Information given on polyps and hemorrhoids.

## 2015-09-01 NOTE — Progress Notes (Signed)
Patient awakening,vss,report to rn 

## 2015-09-02 ENCOUNTER — Telehealth: Payer: Self-pay

## 2015-09-02 NOTE — Telephone Encounter (Signed)
  Follow up Call-  Call back number 09/01/2015  Post procedure Call Back phone  # work [phone]  Permission to leave phone message Yes     Patient questions:  Do you have a fever, pain , or abdominal swelling? No. Pain Score  0 *  Have you tolerated food without any problems? Yes.    Have you been able to return to your normal activities? Yes.    Do you have any questions about your discharge instructions: Diet   No. Medications  No. Follow up visit  No.  Do you have questions or concerns about your Care? No.  Actions: * If pain score is 4 or above: No action needed, pain <4.

## 2015-09-08 ENCOUNTER — Encounter: Payer: Self-pay | Admitting: Internal Medicine

## 2015-09-08 DIAGNOSIS — Z86010 Personal history of colonic polyps: Secondary | ICD-10-CM

## 2015-09-08 DIAGNOSIS — Z860101 Personal history of adenomatous and serrated colon polyps: Secondary | ICD-10-CM

## 2015-09-08 HISTORY — DX: Personal history of adenomatous and serrated colon polyps: Z86.0101

## 2015-09-08 HISTORY — DX: Personal history of colonic polyps: Z86.010

## 2015-09-08 NOTE — Progress Notes (Signed)
Quick Note:  4 mm adenoma Repeat colonoscopy 2023 ______

## 2015-10-14 ENCOUNTER — Other Ambulatory Visit: Payer: Medicare HMO

## 2015-10-14 ENCOUNTER — Telehealth: Payer: Self-pay | Admitting: Family Medicine

## 2015-10-14 ENCOUNTER — Other Ambulatory Visit (INDEPENDENT_AMBULATORY_CARE_PROVIDER_SITE_OTHER): Payer: Medicare HMO

## 2015-10-14 ENCOUNTER — Encounter: Payer: Self-pay | Admitting: *Deleted

## 2015-10-14 DIAGNOSIS — E7800 Pure hypercholesterolemia, unspecified: Secondary | ICD-10-CM

## 2015-10-14 LAB — COMPREHENSIVE METABOLIC PANEL
ALBUMIN: 3.9 g/dL (ref 3.5–5.2)
ALK PHOS: 59 U/L (ref 39–117)
ALT: 18 U/L (ref 0–35)
AST: 25 U/L (ref 0–37)
BILIRUBIN TOTAL: 0.7 mg/dL (ref 0.2–1.2)
BUN: 15 mg/dL (ref 6–23)
CO2: 27 mEq/L (ref 19–32)
CREATININE: 0.76 mg/dL (ref 0.40–1.20)
Calcium: 9.3 mg/dL (ref 8.4–10.5)
Chloride: 105 mEq/L (ref 96–112)
GFR: 80.9 mL/min (ref >60.00)
GLUCOSE: 102 mg/dL — AB (ref 70–99)
POTASSIUM: 3.6 meq/L (ref 3.5–5.1)
SODIUM: 139 meq/L (ref 135–145)
TOTAL PROTEIN: 6.5 g/dL (ref 6.0–8.3)

## 2015-10-14 LAB — LIPID PANEL
CHOLESTEROL: 194 mg/dL (ref 0–200)
HDL: 69.3 mg/dL (ref >39.00)
LDL Cholesterol: 103 mg/dL — ABNORMAL HIGH (ref 0–99)
NONHDL: 124.52
Total CHOL/HDL Ratio: 3
Triglycerides: 108 mg/dL (ref 0.0–149.0)
VLDL: 21.6 mg/dL (ref 0.0–40.0)

## 2015-10-14 NOTE — Telephone Encounter (Signed)
-----   Message from Ellamae Sia sent at 10/05/2015  4:40 PM EDT ----- Regarding: Lab orders for Thursday, 11.3.16 Patient is scheduled for CPX labs, please order future labs, Thanks , Terri  and a CXR

## 2015-11-11 ENCOUNTER — Encounter: Payer: Self-pay | Admitting: Family Medicine

## 2015-11-11 ENCOUNTER — Ambulatory Visit (INDEPENDENT_AMBULATORY_CARE_PROVIDER_SITE_OTHER): Payer: Medicare HMO | Admitting: Family Medicine

## 2015-11-11 ENCOUNTER — Other Ambulatory Visit: Payer: Self-pay | Admitting: Family Medicine

## 2015-11-11 VITALS — BP 120/60 | HR 80 | Temp 98.9°F | Ht 63.0 in | Wt 151.5 lb

## 2015-11-11 DIAGNOSIS — Z1159 Encounter for screening for other viral diseases: Secondary | ICD-10-CM

## 2015-11-11 DIAGNOSIS — Z7189 Other specified counseling: Secondary | ICD-10-CM

## 2015-11-11 DIAGNOSIS — Z0000 Encounter for general adult medical examination without abnormal findings: Secondary | ICD-10-CM

## 2015-11-11 DIAGNOSIS — E7800 Pure hypercholesterolemia, unspecified: Secondary | ICD-10-CM

## 2015-11-11 NOTE — Assessment & Plan Note (Signed)
At goal on no med. 

## 2015-11-11 NOTE — Progress Notes (Signed)
Subjective:    Patient ID: Andrea Mccall, female    DOB: [birth year], 64 y.o.   MRN: [id number]  HPI  I have personally reviewed the Medicare Annual Wellness questionnaire and have noted 1. The patient's medical and social history 2. Their use of alcohol, tobacco or illicit drugs 3. Their current medications and supplements 4. The patient's functional ability including ADL's, fall risks, home safety risks and hearing or visual             impairment. 5. Diet and physical activities 6. Evidence for depression or mood disorders 7.         Updated provider list Cognitive evaluation was performed and recorded on pt medicare questionnaire form. The patients weight, height, BMI and visual acuity have been recorded in the chart  I have made referrals, counseling and provided education to the patient based review of the above and I have provided the pt with a written personalized care plan for preventive services.   Last CPX 11/2014 [facility] Dr. Rogue Bussing, Does not plan to return.  Prediabetes: Glucose 102 A1C at work 5.6  Elevated Cholesterol: LDL at goal on no med. LDL at goal < 130. Lab Results  Component Value Date   CHOL 194 10/14/2015   HDL 69.30 10/14/2015   LDLCALC 103* 10/14/2015   LDLDIRECT 124.0 12/22/2009   TRIG 108.0 10/14/2015   CHOLHDL 3 10/14/2015  Using medications without problems:None She has been working on healthy eating, decreasing eggs, weight loss and regular exercise (30 min walking 3-4 times a week)  Body mass index is 26.84 kg/(m^2). 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   Review of Systems  Constitutional: Negative for fever and fatigue.  HENT: Negative for congestion.   Eyes: Negative for pain.  Respiratory: Negative for  cough and shortness of breath.   Cardiovascular: Negative for chest pain, palpitations and leg swelling.  Gastrointestinal: Negative for abdominal pain.  Genitourinary: Negative for dysuria and vaginal bleeding.  Musculoskeletal: Positive for back pain.  Neurological: Negative for syncope, light-headedness and headaches.  Psychiatric/Behavioral: Negative for dysphoric mood.       Objective:   Physical Exam  Constitutional: Vital signs are normal. She appears well-developed and well-nourished. She is cooperative.  Non-toxic appearance. She does not appear ill. No distress.  HENT:  Head: Normocephalic.  Right Ear: Hearing, tympanic membrane, external ear and ear canal normal.  Left Ear: Hearing, tympanic membrane, external ear and ear canal normal.  Nose: Nose normal.  Eyes: Conjunctivae, EOM and lids are normal. Pupils are equal, round, and reactive to light. Lids are everted and swept, no foreign bodies found.  Neck: Trachea normal and normal range of motion. Neck supple. Carotid bruit is not present. No thyroid mass and no thyromegaly present.  Cardiovascular: Normal rate, regular rhythm, S1 normal, S2 normal, normal heart sounds and intact distal pulses.  Exam reveals no gallop.   No murmur heard. Pulmonary/Chest: Effort normal and breath sounds normal. No respiratory distress. She has no wheezes. She has no rhonchi. She has no rales.  Abdominal: Soft. Normal appearance and bowel sounds are normal. She exhibits no distension, no fluid wave, no abdominal bruit and no mass. There is no hepatosplenomegaly. There is no tenderness. There is no rebound, no guarding  and no CVA tenderness. No hernia.  Lymphadenopathy:    She has no cervical adenopathy.    She has no axillary adenopathy.  Neurological: She is alert. She has normal strength. No cranial nerve deficit or sensory deficit.  Skin: Skin is warm, dry and intact. No rash noted.  Psychiatric: Her speech is normal and behavior is normal.  Judgment normal. Her mood appears not anxious. Cognition and memory are normal. She does not exhibit a depressed mood.          Assessment & Plan:  The patient's preventative maintenance and recommended screening tests for an annual wellness exam were reviewed in full today. Brought up to date unless services declined.  Counselled on the importance of diet, exercise, and its role in overall health and mortality. The patient's FH and SH was reviewed, including their home life, tobacco status, and drug and alcohol status.   Last CPX 11/2014 [facility] Dr. Rogue Bussing: Had pelvic, breast. PAP not indicated after age 63, DVE every 2 years. nml mammo 10/2015  last DEXA > 2 years ago, was nml. Will do now. Colonoscopy Dr. Carlean Purl 09/01/2015 4 mm adenoma, repeat in 2023 Vaccines: uptodate Hep C: Will do.

## 2015-11-11 NOTE — Progress Notes (Signed)
Pre visit review using our clinic review tool, if applicable. No additional management support is needed unless otherwise documented below in the visit note. 

## 2015-11-11 NOTE — Patient Instructions (Addendum)
Work on low [employer], healthy eating, regular exercise and weight loss.  Have bone density at work.  Stop at lab on way out.

## 2015-11-12 ENCOUNTER — Encounter: Payer: Self-pay | Admitting: *Deleted

## 2015-11-12 LAB — HEPATITIS C ANTIBODY: HCV Ab: NEGATIVE

## 2016-01-20 DIAGNOSIS — L299 Pruritus, unspecified: Secondary | ICD-10-CM | POA: Diagnosis not present

## 2016-01-20 DIAGNOSIS — L249 Irritant contact dermatitis, unspecified cause: Secondary | ICD-10-CM | POA: Diagnosis not present

## 2016-01-20 DIAGNOSIS — R208 Other disturbances of skin sensation: Secondary | ICD-10-CM | POA: Diagnosis not present

## 2016-01-20 DIAGNOSIS — Z79899 Other long term (current) drug therapy: Secondary | ICD-10-CM | POA: Diagnosis not present

## 2016-01-28 ENCOUNTER — Ambulatory Visit (INDEPENDENT_AMBULATORY_CARE_PROVIDER_SITE_OTHER): Payer: PPO | Admitting: *Deleted

## 2016-01-28 DIAGNOSIS — Z23 Encounter for immunization: Secondary | ICD-10-CM | POA: Diagnosis not present

## 2016-08-03 DIAGNOSIS — Z79899 Other long term (current) drug therapy: Secondary | ICD-10-CM | POA: Diagnosis not present

## 2016-08-03 DIAGNOSIS — H01139 Eczematous dermatitis of unspecified eye, unspecified eyelid: Secondary | ICD-10-CM | POA: Diagnosis not present

## 2016-10-25 ENCOUNTER — Telehealth: Payer: Self-pay | Admitting: Family Medicine

## 2016-10-25 DIAGNOSIS — R7989 Other specified abnormal findings of blood chemistry: Secondary | ICD-10-CM

## 2016-10-25 DIAGNOSIS — E7800 Pure hypercholesterolemia, unspecified: Secondary | ICD-10-CM

## 2016-10-25 NOTE — Telephone Encounter (Signed)
Pt would like to add a tsh test to her cpe labs.

## 2016-10-26 NOTE — Addendum Note (Signed)
Addended by: Eliezer Lofts E on: 10/26/2016 01:32 PM   Modules accepted: Orders

## 2016-10-26 NOTE — Telephone Encounter (Signed)
What is reason family history, fatigue, cold intolerance, hair loss ?

## 2016-10-26 NOTE — Telephone Encounter (Signed)
Patient returned Donna's call.  Patient said she had lab work done at work and her TSH was a little over 4.  Patient said she'll bring the report to her appointment with [facility] in December.

## 2016-11-09 ENCOUNTER — Other Ambulatory Visit (INDEPENDENT_AMBULATORY_CARE_PROVIDER_SITE_OTHER): Payer: PPO

## 2016-11-09 DIAGNOSIS — E7800 Pure hypercholesterolemia, unspecified: Secondary | ICD-10-CM | POA: Diagnosis not present

## 2016-11-09 DIAGNOSIS — R946 Abnormal results of thyroid function studies: Secondary | ICD-10-CM

## 2016-11-09 DIAGNOSIS — R7989 Other specified abnormal findings of blood chemistry: Secondary | ICD-10-CM

## 2016-11-09 LAB — T4, FREE: Free T4: 0.73 ng/dL (ref 0.60–1.60)

## 2016-11-09 LAB — COMPREHENSIVE METABOLIC PANEL
ALBUMIN: 4.4 g/dL (ref 3.5–5.2)
ALK PHOS: 92 U/L (ref 39–117)
ALT: 25 U/L (ref 0–35)
AST: 28 U/L (ref 0–37)
BILIRUBIN TOTAL: 0.6 mg/dL (ref 0.2–1.2)
BUN: 12 mg/dL (ref 6–23)
CALCIUM: 9.7 mg/dL (ref 8.4–10.5)
CO2: 27 mEq/L (ref 19–32)
Chloride: 105 mEq/L (ref 96–112)
Creatinine, Ser: 0.79 mg/dL (ref 0.40–1.20)
GFR: 77.11 mL/min (ref >60.00)
Glucose, Bld: 99 mg/dL (ref 70–99)
POTASSIUM: 4 meq/L (ref 3.5–5.1)
Sodium: 141 mEq/L (ref 135–145)
TOTAL PROTEIN: 7.1 g/dL (ref 6.0–8.3)

## 2016-11-09 LAB — LIPID PANEL
CHOLESTEROL: 206 mg/dL — AB (ref 0–200)
HDL: 67.1 mg/dL (ref >39.00)
LDL Cholesterol: 128 mg/dL — ABNORMAL HIGH (ref 0–99)
NonHDL: 138.78
Total CHOL/HDL Ratio: 3
Triglycerides: 55 mg/dL (ref 0.0–149.0)
VLDL: 11 mg/dL (ref 0.0–40.0)

## 2016-11-09 LAB — TSH: TSH: 3.01 u[IU]/mL (ref 0.35–4.50)

## 2016-11-09 LAB — T3, FREE: T3, Free: 3.5 pg/mL (ref 2.3–4.2)

## 2016-11-17 ENCOUNTER — Encounter: Payer: Medicare HMO | Admitting: Family Medicine

## 2016-12-07 ENCOUNTER — Encounter: Payer: PPO | Admitting: Family Medicine

## 2016-12-08 ENCOUNTER — Ambulatory Visit (INDEPENDENT_AMBULATORY_CARE_PROVIDER_SITE_OTHER): Payer: PPO | Admitting: Family Medicine

## 2016-12-08 ENCOUNTER — Encounter: Payer: Self-pay | Admitting: Family Medicine

## 2016-12-08 VITALS — BP 158/92 | HR 74 | Temp 97.7°F | Ht 62.75 in | Wt 152.0 lb

## 2016-12-08 DIAGNOSIS — Z0000 Encounter for general adult medical examination without abnormal findings: Secondary | ICD-10-CM

## 2016-12-08 DIAGNOSIS — E7800 Pure hypercholesterolemia, unspecified: Secondary | ICD-10-CM

## 2016-12-08 DIAGNOSIS — L249 Irritant contact dermatitis, unspecified cause: Secondary | ICD-10-CM

## 2016-12-08 NOTE — Patient Instructions (Addendum)
Bring by advance directives when able.  Decrease caffeine.  Follow BP at home.. Goal < 140/90. Call if elevated above goal persistently.

## 2016-12-08 NOTE — Assessment & Plan Note (Signed)
Low risk for CVD...  No clear indication for statin. Will work on lowering chol with lifestyle changes.

## 2016-12-08 NOTE — Assessment & Plan Note (Addendum)
Now off prednisone and methotrexate.Andrea Mccall  Not well controlled, but better in alst 2 weeks.  Followed by derm.

## 2016-12-08 NOTE — Progress Notes (Signed)
Subjective:    Patient ID: Andrea Mccall, female    DOB: February 19, 1951, 65 y.o.   MRN: [id number]  HPI I have personally reviewed the Medicare Annual Wellness questionnaire and have noted 1. The patient's medical and social history 2. Their use of alcohol, tobacco or illicit drugs 3. Their current medications and supplements 4. The patient's functional ability including ADL's, fall risks, home safety risks and hearing or visual             impairment. 5. Diet and physical activities 6. Evidence for depression or mood disorders 7.         Updated provider list Cognitive evaluation was performed and recorded on pt medicare questionnaire form. The patients weight, height, BMI and visual acuity have been recorded in the chart  I have made referrals, counseling and provided education to the patient based review of the above and I have provided the pt with a written personalized care plan for preventive services.    Elevated Cholesterol:  Has increase in last year.. She plans working on healthy lifestyle. Lab Results  Component Value Date   CHOL 206 (H) 11/09/2016   HDL 67.10 11/09/2016   LDLCALC 128 (H) 11/09/2016   LDLDIRECT 124.0 12/22/2009   TRIG 55.0 11/09/2016   CHOLHDL 3 11/09/2016  Diet compliance:moderate sweets. Exercise: walking daily 30 min Other complaints:  Body mass index is 27.14 kg/m. Wt Readings from Last 3 Encounters:  12/08/16 152 lb (68.9 kg)  11/11/15 151 lb 8 oz (68.7 kg)  09/01/15 151 lb (68.5 kg)   Elevated BP today NO past HTN  BP Readings from Last 3 Encounters:  12/08/16 (!) 158/92  11/11/15 120/60  09/01/15 140/65   Prediabetes: 5.5.  Social History /Family History/Past Medical History reviewed and updated if needed. Patient Care Team: Jinny Sanders, MD as PCP - General (Family Medicine)  Review of Systems  Constitutional: Negative for fatigue and fever.  HENT: Negative for congestion.   Eyes: Negative for pain.  Respiratory: Negative  for cough and shortness of breath.   Cardiovascular: Negative for chest pain, palpitations and leg swelling.  Gastrointestinal: Negative for abdominal pain.  Genitourinary: Negative for dysuria and vaginal bleeding.  Neurological: Negative for syncope, light-headedness and headaches.  Psychiatric/Behavioral: Negative for dysphoric mood.       Objective:   Physical Exam  Constitutional: Vital signs are normal. She appears well-developed and well-nourished. She is cooperative.  Non-toxic appearance. She does not appear ill. No distress.  HENT:  Head: Normocephalic.  Right Ear: Hearing, tympanic membrane, external ear and ear canal normal.  Left Ear: Hearing, tympanic membrane, external ear and ear canal normal.  Nose: Nose normal.  Eyes: Conjunctivae, EOM and lids are normal. Pupils are equal, round, and reactive to light. Lids are everted and swept, no foreign bodies found.  Neck: Trachea normal and normal range of motion. Neck supple. Carotid bruit is not present. No thyroid mass and no thyromegaly present.  Cardiovascular: Normal rate, regular rhythm, S1 normal, S2 normal, normal heart sounds and intact distal pulses.  Exam reveals no gallop.   No murmur heard. Pulmonary/Chest: Effort normal and breath sounds normal. No respiratory distress. She has no wheezes. She has no rhonchi. She has no rales.  Abdominal: Soft. Normal appearance and bowel sounds are normal. She exhibits no distension, no fluid wave, no abdominal bruit and no mass. There is no hepatosplenomegaly. There is no tenderness. There is no rebound, no guarding and no CVA tenderness. No hernia.  Lymphadenopathy:    She has no cervical adenopathy.    She has no axillary adenopathy.  Neurological: She is alert. She has normal strength. No cranial nerve deficit or sensory deficit.  Skin: Skin is warm, dry and intact. No rash noted.  Psychiatric: Her speech is normal and behavior is normal. Judgment normal. Her mood appears not  anxious. Cognition and memory are normal. She does not exhibit a depressed mood.          Assessment & Plan:  The patient's preventative maintenance and recommended screening tests for an annual wellness exam were reviewed in full today. Brought up to date unless services declined.  Counselled on the importance of diet, exercise, and its role in overall health and mortality. The patient's FH and SH was reviewed, including their home life, tobacco status, and drug and alcohol status.   Vaccines: uptodate Pap/DVE:11/2014 [facility] Dr. Rogue Bussing: Had pelvic, breast. PAP not indicated after age 33. Mammo: last 2015.. Due this year Bone Density: plans to discuss with GYN. I recommend especially since on prednisone at times. Colon:  Dr. Carlean Purl 09/01/2015 4 mm adenoma, repeat in 2023 Smoking Status: none ETOH/ drug use: none  Hep C:  done

## 2016-12-08 NOTE — Progress Notes (Signed)
Pre visit review using our clinic review tool, if applicable. No additional management support is needed unless otherwise documented below in the visit note. 

## 2016-12-15 ENCOUNTER — Other Ambulatory Visit: Payer: Self-pay | Admitting: Obstetrics and Gynecology

## 2016-12-15 DIAGNOSIS — Z01419 Encounter for gynecological examination (general) (routine) without abnormal findings: Secondary | ICD-10-CM | POA: Diagnosis not present

## 2016-12-15 DIAGNOSIS — Z124 Encounter for screening for malignant neoplasm of cervix: Secondary | ICD-10-CM | POA: Diagnosis not present

## 2016-12-15 DIAGNOSIS — Z1231 Encounter for screening mammogram for malignant neoplasm of breast: Secondary | ICD-10-CM | POA: Diagnosis not present

## 2016-12-19 LAB — CYTOLOGY - PAP

## 2017-07-17 DIAGNOSIS — H0233 Blepharochalasis right eye, unspecified eyelid: Secondary | ICD-10-CM | POA: Diagnosis not present

## 2017-07-17 DIAGNOSIS — E0500 Thyrotoxicosis with diffuse goiter without thyrotoxic crisis or storm: Secondary | ICD-10-CM | POA: Diagnosis not present

## 2017-07-17 DIAGNOSIS — H0236 Blepharochalasis left eye, unspecified eyelid: Secondary | ICD-10-CM | POA: Diagnosis not present

## 2017-07-19 DIAGNOSIS — H0233 Blepharochalasis right eye, unspecified eyelid: Secondary | ICD-10-CM | POA: Diagnosis not present

## 2017-07-19 DIAGNOSIS — H0236 Blepharochalasis left eye, unspecified eyelid: Secondary | ICD-10-CM | POA: Diagnosis not present

## 2017-08-01 DIAGNOSIS — H0233 Blepharochalasis right eye, unspecified eyelid: Secondary | ICD-10-CM | POA: Diagnosis not present

## 2017-08-29 DIAGNOSIS — H0233 Blepharochalasis right eye, unspecified eyelid: Secondary | ICD-10-CM | POA: Diagnosis not present

## 2017-08-29 DIAGNOSIS — H0236 Blepharochalasis left eye, unspecified eyelid: Secondary | ICD-10-CM | POA: Diagnosis not present

## 2017-10-01 DIAGNOSIS — E02 Subclinical iodine-deficiency hypothyroidism: Secondary | ICD-10-CM | POA: Diagnosis not present

## 2017-10-01 DIAGNOSIS — E063 Autoimmune thyroiditis: Secondary | ICD-10-CM | POA: Diagnosis not present

## 2017-10-09 DIAGNOSIS — Z23 Encounter for immunization: Secondary | ICD-10-CM | POA: Diagnosis not present

## 2017-11-06 DIAGNOSIS — H0233 Blepharochalasis right eye, unspecified eyelid: Secondary | ICD-10-CM | POA: Diagnosis not present

## 2017-11-06 DIAGNOSIS — H0236 Blepharochalasis left eye, unspecified eyelid: Secondary | ICD-10-CM | POA: Diagnosis not present

## 2017-11-06 DIAGNOSIS — E0500 Thyrotoxicosis with diffuse goiter without thyrotoxic crisis or storm: Secondary | ICD-10-CM | POA: Diagnosis not present

## 2017-11-13 DIAGNOSIS — E039 Hypothyroidism, unspecified: Secondary | ICD-10-CM | POA: Diagnosis not present

## 2017-11-13 DIAGNOSIS — E02 Subclinical iodine-deficiency hypothyroidism: Secondary | ICD-10-CM | POA: Diagnosis not present

## 2017-12-12 DIAGNOSIS — S60222A Contusion of left hand, initial encounter: Secondary | ICD-10-CM | POA: Diagnosis not present

## 2017-12-12 DIAGNOSIS — M25552 Pain in left hip: Secondary | ICD-10-CM | POA: Diagnosis not present

## 2017-12-12 DIAGNOSIS — Z9181 History of falling: Secondary | ICD-10-CM | POA: Diagnosis not present

## 2017-12-25 ENCOUNTER — Telehealth: Payer: Self-pay | Admitting: Family Medicine

## 2017-12-25 DIAGNOSIS — E7800 Pure hypercholesterolemia, unspecified: Secondary | ICD-10-CM

## 2017-12-25 NOTE — Telephone Encounter (Signed)
-----   Message from Ellamae Sia sent at 12/25/2017  3:17 PM EST ----- Regarding: Lab orders for Thursday, 1.17.19 Patient is scheduled for CPX labs, please order future labs, Thanks , Karna Christmas

## 2017-12-27 ENCOUNTER — Other Ambulatory Visit: Payer: PPO

## 2017-12-27 ENCOUNTER — Other Ambulatory Visit (INDEPENDENT_AMBULATORY_CARE_PROVIDER_SITE_OTHER): Payer: PPO

## 2017-12-27 DIAGNOSIS — E7800 Pure hypercholesterolemia, unspecified: Secondary | ICD-10-CM | POA: Diagnosis not present

## 2017-12-27 LAB — COMPREHENSIVE METABOLIC PANEL
ALT: 16 U/L (ref 0–35)
AST: 25 U/L (ref 0–37)
Albumin: 4.4 g/dL (ref 3.5–5.2)
Alkaline Phosphatase: 89 U/L (ref 39–117)
BILIRUBIN TOTAL: 0.5 mg/dL (ref 0.2–1.2)
BUN: 16 mg/dL (ref 6–23)
CALCIUM: 9.8 mg/dL (ref 8.4–10.5)
CO2: 27 meq/L (ref 19–32)
CREATININE: 0.72 mg/dL (ref 0.40–1.20)
Chloride: 104 mEq/L (ref 96–112)
GFR: 85.54 mL/min (ref >60.00)
GLUCOSE: 106 mg/dL — AB (ref 70–99)
Potassium: 4 mEq/L (ref 3.5–5.1)
SODIUM: 139 meq/L (ref 135–145)
Total Protein: 7.1 g/dL (ref 6.0–8.3)

## 2017-12-27 LAB — LIPID PANEL
CHOL/HDL RATIO: 3
Cholesterol: 214 mg/dL — ABNORMAL HIGH (ref 0–200)
HDL: 68.3 mg/dL (ref >39.00)
LDL Cholesterol: 131 mg/dL — ABNORMAL HIGH (ref 0–99)
NonHDL: 145.42
TRIGLYCERIDES: 71 mg/dL (ref 0.0–149.0)
VLDL: 14.2 mg/dL (ref 0.0–40.0)

## 2018-01-02 ENCOUNTER — Telehealth: Payer: Self-pay

## 2018-01-02 ENCOUNTER — Encounter: Payer: Self-pay | Admitting: *Deleted

## 2018-01-02 NOTE — Telephone Encounter (Signed)
Attempted to reach pt to schedule AWV. Requested patient come in at 10:30AM prior to CPE at 11:00AM, if possible. Spouse answered call and stated he would leave her a message.

## 2018-01-03 ENCOUNTER — Encounter: Payer: Self-pay | Admitting: Family Medicine

## 2018-01-03 ENCOUNTER — Ambulatory Visit (INDEPENDENT_AMBULATORY_CARE_PROVIDER_SITE_OTHER): Payer: PPO

## 2018-01-03 ENCOUNTER — Ambulatory Visit (INDEPENDENT_AMBULATORY_CARE_PROVIDER_SITE_OTHER): Payer: PPO | Admitting: Family Medicine

## 2018-01-03 VITALS — BP 138/80 | HR 81 | Temp 97.7°F | Ht 62.5 in | Wt 155.8 lb

## 2018-01-03 DIAGNOSIS — Z0000 Encounter for general adult medical examination without abnormal findings: Secondary | ICD-10-CM

## 2018-01-03 DIAGNOSIS — E7800 Pure hypercholesterolemia, unspecified: Secondary | ICD-10-CM

## 2018-01-03 NOTE — Patient Instructions (Signed)
Andrea Mccall , Thank you for taking time to come for your Medicare Wellness Visit. I appreciate your ongoing commitment to your health goals. Please review the following plan we discussed and let me know if I can assist you in the future.   These are the goals we discussed: Goals    . Increase physical activity     Starting 01/03/2018, I will continue to walk for at least 30 min 4 days per week.        This is a list of the screening recommended for you and due dates:  Health Maintenance  Topic Date Due  . Mammogram  02/08/2018*  . DEXA scan (bone density measurement)  01/03/2029*  . Tetanus Vaccine  07/29/2018  . Colon Cancer Screening  08/31/2022  . Flu Shot  Completed  .  Hepatitis C: One time screening is recommended by Center for Disease Control  (CDC) for  adults born from [birth year] through 1967.   Completed  . Pneumonia vaccines  Completed  *Topic was postponed. The date shown is not the original due date.   Preventive Care for Adults  A healthy lifestyle and preventive care can promote health and wellness. Preventive health guidelines for adults include the following key practices.  . A routine yearly physical is a good way to check with your health care provider about your health and preventive screening. It is a chance to share any concerns and updates on your health and to receive a thorough exam.  . Visit your dentist for a routine exam and preventive care every 6 months. Brush your teeth twice a day and floss once a day. Good oral hygiene prevents tooth decay and gum disease.  . The frequency of eye exams is based on your age, health, family medical history, use  of contact lenses, and other factors. Follow your health care provider's recommendations for frequency of eye exams.  . Eat a healthy diet. Foods like vegetables, fruits, whole grains, low-fat dairy products, and lean protein foods contain the nutrients you need without too many calories. Decrease your intake of  foods high in solid fats, added sugars, and salt. Eat the right amount of calories for you. Get information about a proper diet from your health care provider, if necessary.  . Regular physical exercise is one of the most important things you can do for your health. Most adults should get at least 150 minutes of moderate-intensity exercise (any activity that increases your heart rate and causes you to sweat) each week. In addition, most adults need muscle-strengthening exercises on 2 or more days a week.  Silver Sneakers may be a benefit available to you. To determine eligibility, you may visit the website: www.silversneakers.com or contact program at (760)312-6340 Mon-Fri between 8AM-8PM.   . Maintain a healthy weight. The body mass index (BMI) is a screening tool to identify possible weight problems. It provides an estimate of body fat based on height and weight. Your health care provider can find your BMI and can help you achieve or maintain a healthy weight.   For adults 20 years and older: ? A BMI below 18.5 is considered underweight. ? A BMI of 18.5 to 24.9 is normal. ? A BMI of 25 to 29.9 is considered overweight. ? A BMI of 30 and above is considered obese.   . Maintain normal blood lipids and cholesterol levels by exercising and minimizing your intake of saturated fat. Eat a balanced diet with plenty of fruit and vegetables. Blood tests  for lipids and cholesterol should begin at age 75 and be repeated every 5 years. If your lipid or cholesterol levels are high, you are over 50, or you are at high risk for heart disease, you may need your cholesterol levels checked more frequently. Ongoing high lipid and cholesterol levels should be treated with medicines if diet and exercise are not working.  . If you smoke, find out from your health care provider how to quit. If you do not use tobacco, please do not start.  . If you choose to drink alcohol, please do not consume more than 2 drinks per  day. One drink is considered to be 12 ounces (355 mL) of beer, 5 ounces (148 mL) of wine, or 1.5 ounces (44 mL) of liquor.  . If you are 27-81 years old, ask your health care provider if you should take aspirin to prevent strokes.  . Use sunscreen. Apply sunscreen liberally and repeatedly throughout the day. You should seek shade when your shadow is shorter than you. Protect yourself by wearing long sleeves, pants, a wide-brimmed hat, and sunglasses year round, whenever you are outdoors.  . Once a month, do a whole body skin exam, using a mirror to look at the skin on your back. Tell your health care provider of new moles, moles that have irregular borders, moles that are larger than a pencil eraser, or moles that have changed in shape or color.

## 2018-01-03 NOTE — Progress Notes (Signed)
PCP notes:   Health maintenance:  Mammogram - addressed Bone density - pt declined  Abnormal screenings:   Fall risk - hx of accidental work-related fall  Patient concerns:   None  Nurse concerns:  None  Next PCP appt:   01/03/18 @ 1100

## 2018-01-03 NOTE — Progress Notes (Signed)
Subjective:   Andrea Mccall is a 67 y.o. female who presents for Medicare Annual (Subsequent) preventive examination.  Review of Systems:  N/A Cardiac Risk Factors include: advanced age (>73men, >65 women)     Objective:     Vitals: BP 138/80 (BP Location: Right Arm, Patient Position: Sitting, Cuff Size: Normal)   Pulse 81   Temp 97.7 F (36.5 C) (Oral)   Ht 5' 2.5" (1.588 m) Comment: no shoes  Wt 155 lb 12 oz (70.6 kg)   SpO2 98%   BMI 28.03 kg/m   Body mass index is 28.03 kg/m.  Advanced Directives 01/03/2018 12/08/2016 09/01/2015 08/18/2015  Does Patient Have a Medical Advance Directive? Yes Yes Yes Yes  Type of Paramedic of [place];Living will [place];Living will Healthcare Power of [place];Living will  Does patient want to make changes to medical advance directive? - No - Patient declined - No - Patient declined  Copy of [place] in Chart? No - copy requested No - copy requested - No - copy requested    Tobacco Social History   Tobacco Use  Smoking Status Former Smoker  . Packs/day: 10.00  . Types: Cigarettes  . Last attempt to quit: 04/19/1981  . Years since quitting: 36.7  Smokeless Tobacco Never Used     Counseling given: No   Clinical Intake:  Pre-visit preparation completed: Yes  Pain : No/denies pain Pain Score: 0-No pain     Nutritional Status: BMI 25 -29 Overweight Nutritional Risks: None Diabetes: No  How often do you need to have someone help you when you read instructions, pamphlets, or other written materials from your doctor or pharmacy?: 1 - Never What is the last grade level you completed in school?: Associate degree  Interpreter Needed?: No  Comments: pt lives with spouse Information entered by :: LPinson, LPN  Past Medical History:  Diagnosis Date  . Hepatitis B   . History of chicken pox   . Hx of adenomatous polyp of colon  09/08/2015  . Inflammations of eyelids    taking Methatrexate and Prednisone  . 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 BREAST SURGERY    . CHOLECYSTECTOMY    . COLONOSCOPY    . TONSILLECTOMY AND ADENOIDECTOMY     Family History  Problem Relation Age of Onset  . COPD Mother   . Alcohol abuse Mother   . Hyperlipidemia Mother   . Hypertension Mother   . Heart failure Father   . Hypertension Father   . Diabetes Sister   . Heart disease Sister   . Heart murmur Sister   . Alcohol abuse Maternal Grandfather   . Heart murmur Sister   . Arthritis Paternal Uncle   . Lung cancer Maternal Aunt   . Colon cancer Neg Hx   . Colon polyps Neg Hx   . Esophageal cancer Neg Hx   . Stomach cancer Neg Hx   . Rectal cancer Neg Hx    Social History   Socioeconomic History  . Marital status: Married    Spouse name: Andrea Mccall  . Number of children: 3  . Years of education: None  . Highest education level: None  Social Needs  . Financial resource strain: None  . Food insecurity - worry: None  . Food insecurity - inability: None  . Transportation needs - medical: None  . Transportation  needs - non-medical: None  Occupational History  . Occupation: phlebotamist    Comment: [employer]  Tobacco Use  . Smoking status: Former Smoker    Packs/day: 10.00    Types: Cigarettes    Last attempt to quit: 04/19/1981    Years since quitting: 36.7  . Smokeless tobacco: Never Used  Substance and Sexual Activity  . Alcohol use: No    Alcohol/week: 0.0 oz    Comment: occasionally wine  . Drug use: No  . Sexual activity: No  Other Topics Concern  . None  Social History Narrative  . None    Outpatient Encounter Medications as of 01/03/2018  Medication Sig  . doxycycline (VIBRAMYCIN) 50 MG capsule Take 50 mg by mouth 2 (two) times daily.  Marland Kitchen levothyroxine (SYNTHROID, LEVOTHROID) 50 MCG tablet Take 50 mcg by mouth daily  before breakfast.  . [DISCONTINUED] predniSONE (DELTASONE) 5 MG tablet Take 5 mg by mouth daily as needed.   No facility-administered encounter medications on file as of 01/03/2018.     Activities of Daily Living In your present state of health, do you have any difficulty performing the following activities: 01/03/2018  Hearing? N  Vision? N  Difficulty concentrating or making decisions? N  Walking or climbing stairs? N  Dressing or bathing? N  Doing errands, shopping? N  Preparing Food and eating ? N  Using the Toilet? N  In the past six months, have you accidently leaked urine? N  Do you have problems with loss of bowel control? N  Managing your Medications? N  Managing your Finances? N  Housekeeping or managing your Housekeeping? N  Some recent data might be hidden    Patient Care Team: Jinny Sanders, MD as PCP - General (Family Medicine)    Assessment:   This is a routine wellness examination for Andrea Mccall.  Hearing Screening   125Hz  250Hz  500Hz  1000Hz  2000Hz  3000Hz  4000Hz  6000Hz  8000Hz   Right ear:   40 40 40  40    Left ear:   40 40 40  40    Vision Screening Comments: Last vision exam in June 2018 '  Exercise Activities and Dietary recommendations Current Exercise Habits: Home exercise routine, Type of exercise: walking, Time (Minutes): 30, Frequency (Times/Week): 4, Weekly Exercise (Minutes/Week): 120, Intensity: Moderate, Exercise limited by: None identified  Goals    . Increase physical activity     Starting 01/03/2018, I will continue to walk for at least 30 min 4 days per week.        Fall Risk Fall Risk  01/03/2018 12/08/2016 11/11/2015  Falls in the past year? Yes No No  Number falls in past yr: 1 - -  Injury with Fall? Yes - -   Depression Screen PHQ 2/9 Scores 01/03/2018 12/08/2016 11/11/2015  PHQ - 2 Score 0 0 0  PHQ- 9 Score 0 - -     Cognitive Function MMSE - Mini Mental State Exam 01/03/2018  Orientation to time 5  Orientation to Place 5    Registration 3  Attention/ Calculation 0  Recall 3  Language- name 2 objects 0  Language- repeat 1  Language- follow 3 step command 3  Language- read & follow direction 0  Write a sentence 0  Copy design 0  Total score 20       PLEASE NOTE: A Mini-Cog screen was completed. Maximum score is 20. A value of 0 denotes this part of [place] MMSE was not completed or the patient failed  this part of the Mini-Cog screening.   Mini-Cog Screening Orientation to Time - Max 5 pts Orientation to Place - Max 5 pts Registration - Max 3 pts Recall - Max 3 pts Language Repeat - Max 1 pts Language Follow 3 Step Command - Max 3 pts   Immunization History  Administered Date(s) Administered  . Influenza Whole 10/20/2009  . Influenza, High Dose Seasonal PF 10/13/2015, 10/09/2017  . Influenza-Unspecified 09/20/2014, 09/28/2016  . Pneumococcal Conjugate-13 01/21/2015  . Pneumococcal Polysaccharide-23 01/28/2016  . Td 07/29/2008  . Tdap 07/29/2008  . Zoster 11/08/2009    Screening Tests Health Maintenance  Topic Date Due  . MAMMOGRAM  02/08/2018 (Originally 10/14/2017)  . DEXA SCAN  01/03/2029 (Originally 09/05/2014)  . TETANUS/TDAP  07/29/2018  . COLONOSCOPY  08/31/2022  . INFLUENZA VACCINE  Completed  . Hepatitis C Screening  Completed  . PNA vac Low Risk Adult  Completed     Plan:     I have personally reviewed, addressed, and noted the following in the patient's chart:  A. Medical and social history B. Use of alcohol, tobacco or illicit drugs  C. Current medications and supplements D. Functional ability and status E.  Nutritional status F.  Physical activity G. Advance directives H. List of other physicians I.  Hospitalizations, surgeries, and ER visits in previous 12 months J.  [place] to include hearing, vision, cognitive, depression L. Referrals and appointments - none  In addition, I have reviewed and discussed with patient certain preventive protocols,  quality metrics, and best practice recommendations. A written personalized care plan for preventive services as well as general preventive health recommendations were provided to patient.  See attached scanned questionnaire for additional information.   Signed,   Lindell Noe, MHA, BS, LPN Health Coach

## 2018-01-03 NOTE — Progress Notes (Signed)
I reviewed health advisor's note, was available for consultation, and agree with documentation and plan.  

## 2018-01-03 NOTE — Progress Notes (Signed)
Pre visit review using our clinic review tool, if applicable. No additional management support is needed unless otherwise documented below in the visit note. 

## 2018-01-03 NOTE — Patient Instructions (Addendum)
Work on increased exercise and low cholesterol diet.   Set up bone density if interested.

## 2018-01-03 NOTE — Progress Notes (Signed)
Subjective:    Patient ID: Andrea Mccall, female    DOB: 1951/12/11, 67 y.o.   MRN: [id number]  HPI  The patient presents for complete physical and review of chronic health problems. He/She also has the following acute concerns today:  The patient saw Candis Musa, LPN for medicare wellness. Note reviewed in detail and important notes copied below.   Unremarkable, no gaps. Fall Risk  01/03/2018 12/08/2016 11/11/2015  Falls in the past year? Yes No No  Number falls in past yr: 1 - -  Injury with Fall? Yes - -   Elevated Cholesterol:  AHA 10 year risk: 8.5%  on no med. Lab Results  Component Value Date   CHOL 214 (H) 12/27/2017   HDL 68.30 12/27/2017   LDLCALC 131 (H) 12/27/2017   LDLDIRECT 124.0 12/22/2009   TRIG 71.0 12/27/2017   CHOLHDL 3 12/27/2017  Using medications without problems: Muscle aches:  Diet compliance: moderate Exercise: walk 4 days a week Other complaints:  Chronic irritant dermatitis:  Possibly due to  Glasses.. Irritation.  She is currently seeing Dr. Chalmers Cater for subclinical hypothyroid.. TSH elevated and TPO elevated. Now on levothyroxine 50 mcg daily.   Body mass index is 28.03 kg/m.  Social History /Family History/Past Medical History reviewed in detail and updated in EMR if needed. Blood pressure 138/80, pulse 81, temperature 97.7 F (36.5 C), temperature source Oral, height 5' 2.5" (1.588 m), weight 155 lb 12 oz (70.6 kg), SpO2 98 %.    BP Readings from Last 3 Encounters:  01/03/18 138/80  12/08/16 (!) 158/92  11/11/15 120/60    Review of Systems  Constitutional: Negative for fatigue and fever.  HENT: Negative for congestion.   Eyes: Negative for pain.  Respiratory: Negative for cough and shortness of breath.   Cardiovascular: Negative for chest pain, palpitations and leg swelling.  Gastrointestinal: Negative for abdominal pain.  Genitourinary: Negative for dysuria and vaginal bleeding.  Musculoskeletal: Negative for back pain.    Neurological: Negative for syncope, light-headedness and headaches.  Psychiatric/Behavioral: Negative for dysphoric mood.       Objective:   Physical Exam  Constitutional: Vital signs are normal. She appears well-developed and well-nourished. She is cooperative.  Non-toxic appearance. She does not appear ill. No distress.  HENT:  Head: Normocephalic.  Right Ear: Hearing, tympanic membrane, external ear and ear canal normal.  Left Ear: Hearing, tympanic membrane, external ear and ear canal normal.  Nose: Nose normal.  Eyes: Conjunctivae, EOM and lids are normal. Pupils are equal, round, and reactive to light. Lids are everted and swept, no foreign bodies found.  Neck: Trachea normal and normal range of motion. Neck supple. Carotid bruit is not present. No thyroid mass and no thyromegaly present.  Cardiovascular: Normal rate, regular rhythm, S1 normal, S2 normal, normal heart sounds and intact distal pulses. Exam reveals no gallop.  No murmur heard. Pulmonary/Chest: Effort normal and breath sounds normal. No respiratory distress. She has no wheezes. She has no rhonchi. She has no rales.  Abdominal: Soft. Normal appearance and bowel sounds are normal. She exhibits no distension, no fluid wave, no abdominal bruit and no mass. There is no hepatosplenomegaly. There is no tenderness. There is no rebound, no guarding and no CVA tenderness. No hernia.  Lymphadenopathy:    She has no cervical adenopathy.    She has no axillary adenopathy.  Neurological: She is alert. She has normal strength. No cranial nerve deficit or sensory deficit.  Skin: Skin is warm, dry  and intact. No rash noted.  Psychiatric: Her speech is normal and behavior is normal. Judgment normal. Her mood appears not anxious. Cognition and memory are normal. She does not exhibit a depressed mood.          Assessment & Plan:  The patient's preventative maintenance and recommended screening tests for an annual wellness exam were  reviewed in full today. Brought up to date unless services declined.  Counselled on the importance of diet, exercise, and its role in overall health and mortality. The patient's FH and SH was reviewed, including their home life, tobacco status, and drug and alcohol status.   Vaccines: uptodate Pap/DVE:11/2014 [facility] Dr. Rogue Bussing: Had pelvic, breast. PAP not indicated after age 67. Mammo: last 2016.Marland Kitchen Scheduled 01/2018 Bone Density:  Rx for bone density given to d at Dr. Truitt Leep D 3 and weight bearing Colon: Dr. Carlean Purl 09/01/2015 4 mm adenoma, repeat in 2023 Smoking Status: none ETOH/ drug use: occ/none Hep C:  done

## 2018-01-09 DIAGNOSIS — E02 Subclinical iodine-deficiency hypothyroidism: Secondary | ICD-10-CM | POA: Diagnosis not present

## 2018-01-09 DIAGNOSIS — E063 Autoimmune thyroiditis: Secondary | ICD-10-CM | POA: Diagnosis not present

## 2018-01-11 DIAGNOSIS — Z1231 Encounter for screening mammogram for malignant neoplasm of breast: Secondary | ICD-10-CM | POA: Diagnosis not present

## 2018-01-16 ENCOUNTER — Other Ambulatory Visit: Payer: Self-pay | Admitting: Obstetrics and Gynecology

## 2018-01-16 DIAGNOSIS — R928 Other abnormal and inconclusive findings on diagnostic imaging of breast: Secondary | ICD-10-CM

## 2018-01-25 ENCOUNTER — Ambulatory Visit
Admission: RE | Admit: 2018-01-25 | Discharge: 2018-01-25 | Disposition: A | Discharge status: Home / Self Care | Payer: PPO | Source: Ambulatory Visit | Attending: Obstetrics and Gynecology | Admitting: Obstetrics and Gynecology

## 2018-01-25 ENCOUNTER — Ambulatory Visit: Payer: PPO

## 2018-01-25 DIAGNOSIS — R928 Other abnormal and inconclusive findings on diagnostic imaging of breast: Secondary | ICD-10-CM | POA: Diagnosis not present

## 2018-02-25 DIAGNOSIS — E063 Autoimmune thyroiditis: Secondary | ICD-10-CM | POA: Diagnosis not present

## 2018-02-25 DIAGNOSIS — E02 Subclinical iodine-deficiency hypothyroidism: Secondary | ICD-10-CM | POA: Diagnosis not present

## 2018-02-28 DIAGNOSIS — H0233 Blepharochalasis right eye, unspecified eyelid: Secondary | ICD-10-CM | POA: Diagnosis not present

## 2018-02-28 DIAGNOSIS — H0236 Blepharochalasis left eye, unspecified eyelid: Secondary | ICD-10-CM | POA: Diagnosis not present

## 2018-03-21 DIAGNOSIS — H0236 Blepharochalasis left eye, unspecified eyelid: Secondary | ICD-10-CM | POA: Diagnosis not present

## 2018-03-21 DIAGNOSIS — E0500 Thyrotoxicosis with diffuse goiter without thyrotoxic crisis or storm: Secondary | ICD-10-CM | POA: Diagnosis not present

## 2018-03-21 DIAGNOSIS — H0233 Blepharochalasis right eye, unspecified eyelid: Secondary | ICD-10-CM | POA: Diagnosis not present

## 2018-04-25 DIAGNOSIS — H0233 Blepharochalasis right eye, unspecified eyelid: Secondary | ICD-10-CM | POA: Diagnosis not present

## 2018-04-25 DIAGNOSIS — H0236 Blepharochalasis left eye, unspecified eyelid: Secondary | ICD-10-CM | POA: Diagnosis not present

## 2018-06-11 DIAGNOSIS — H0233 Blepharochalasis right eye, unspecified eyelid: Secondary | ICD-10-CM | POA: Diagnosis not present

## 2018-06-11 DIAGNOSIS — H0236 Blepharochalasis left eye, unspecified eyelid: Secondary | ICD-10-CM | POA: Diagnosis not present

## 2018-07-31 DIAGNOSIS — H0236 Blepharochalasis left eye, unspecified eyelid: Secondary | ICD-10-CM | POA: Diagnosis not present

## 2018-07-31 DIAGNOSIS — Z01818 Encounter for other preprocedural examination: Secondary | ICD-10-CM | POA: Diagnosis not present

## 2018-08-29 ENCOUNTER — Ambulatory Visit (HOSPITAL_COMMUNITY): Admission: RE | Admit: 2018-08-29 | Payer: PPO | Source: Ambulatory Visit | Admitting: Oculoplastics Ophthalmology

## 2018-08-29 ENCOUNTER — Encounter (HOSPITAL_COMMUNITY): Admission: RE | Payer: Self-pay | Source: Ambulatory Visit

## 2018-08-29 SURGERY — BLEPHAROPLASTY
Anesthesia: General | Laterality: Bilateral

## 2018-09-09 DIAGNOSIS — H02403 Unspecified ptosis of bilateral eyelids: Secondary | ICD-10-CM | POA: Diagnosis not present

## 2018-09-09 DIAGNOSIS — H0236 Blepharochalasis left eye, unspecified eyelid: Secondary | ICD-10-CM | POA: Diagnosis not present

## 2018-09-09 DIAGNOSIS — H02832 Dermatochalasis of right lower eyelid: Secondary | ICD-10-CM | POA: Diagnosis not present

## 2018-09-09 DIAGNOSIS — H02413 Mechanical ptosis of bilateral eyelids: Secondary | ICD-10-CM | POA: Diagnosis not present

## 2018-09-09 DIAGNOSIS — H02834 Dermatochalasis of left upper eyelid: Secondary | ICD-10-CM | POA: Diagnosis not present

## 2018-09-09 DIAGNOSIS — H02033 Senile entropion of right eye, unspecified eyelid: Secondary | ICD-10-CM | POA: Diagnosis not present

## 2018-09-09 DIAGNOSIS — H0233 Blepharochalasis right eye, unspecified eyelid: Secondary | ICD-10-CM | POA: Diagnosis not present

## 2018-10-30 DIAGNOSIS — E02 Subclinical iodine-deficiency hypothyroidism: Secondary | ICD-10-CM | POA: Diagnosis not present

## 2018-10-30 DIAGNOSIS — E063 Autoimmune thyroiditis: Secondary | ICD-10-CM | POA: Diagnosis not present

## 2018-12-19 DIAGNOSIS — E0500 Thyrotoxicosis with diffuse goiter without thyrotoxic crisis or storm: Secondary | ICD-10-CM | POA: Diagnosis not present

## 2019-01-09 ENCOUNTER — Telehealth: Payer: Self-pay | Admitting: Family Medicine

## 2019-01-09 ENCOUNTER — Other Ambulatory Visit (INDEPENDENT_AMBULATORY_CARE_PROVIDER_SITE_OTHER): Payer: PPO

## 2019-01-09 DIAGNOSIS — E7800 Pure hypercholesterolemia, unspecified: Secondary | ICD-10-CM | POA: Diagnosis not present

## 2019-01-09 LAB — LIPID PANEL
Cholesterol: 228 mg/dL — ABNORMAL HIGH (ref 0–200)
HDL: 73.2 mg/dL (ref >39.00)
LDL Cholesterol: 134 mg/dL — ABNORMAL HIGH (ref 0–99)
NonHDL: 154.98
Total CHOL/HDL Ratio: 3
Triglycerides: 105 mg/dL (ref 0.0–149.0)
VLDL: 21 mg/dL (ref 0.0–40.0)

## 2019-01-09 LAB — COMPREHENSIVE METABOLIC PANEL
ALT: 22 U/L (ref 0–35)
AST: 27 U/L (ref 0–37)
Albumin: 4.4 g/dL (ref 3.5–5.2)
Alkaline Phosphatase: 78 U/L (ref 39–117)
BUN: 15 mg/dL (ref 6–23)
CO2: 28 mEq/L (ref 19–32)
Calcium: 9.8 mg/dL (ref 8.4–10.5)
Chloride: 101 mEq/L (ref 96–112)
Creatinine, Ser: 0.77 mg/dL (ref 0.40–1.20)
GFR: 74.25 mL/min (ref >60.00)
Glucose, Bld: 112 mg/dL — ABNORMAL HIGH (ref 70–99)
Potassium: 4.1 mEq/L (ref 3.5–5.1)
Sodium: 138 mEq/L (ref 135–145)
Total Bilirubin: 0.7 mg/dL (ref 0.2–1.2)
Total Protein: 6.7 g/dL (ref 6.0–8.3)

## 2019-01-09 NOTE — Telephone Encounter (Signed)
-----   Message from Ellamae Sia sent at 12/30/2018  9:14 AM EST ----- Regarding: Lab orders for Thursday, 1.30.20 Patient is scheduled for CPX labs, please order future labs, Thanks , Karna Christmas

## 2019-01-17 ENCOUNTER — Encounter: Payer: PPO | Admitting: Family Medicine

## 2019-01-17 ENCOUNTER — Ambulatory Visit: Payer: PPO

## 2019-01-17 ENCOUNTER — Telehealth: Payer: Self-pay | Admitting: Family Medicine

## 2019-01-17 DIAGNOSIS — E7800 Pure hypercholesterolemia, unspecified: Secondary | ICD-10-CM

## 2019-01-17 NOTE — Telephone Encounter (Signed)
-----   Message from Eustace Pen, LPN sent at [phone]  3:54 PM EST ----- Regarding: Labs 2/7 Lab orders needed. Thank you.

## 2019-01-31 ENCOUNTER — Ambulatory Visit (INDEPENDENT_AMBULATORY_CARE_PROVIDER_SITE_OTHER): Payer: PPO | Admitting: Family Medicine

## 2019-01-31 ENCOUNTER — Encounter: Payer: Self-pay | Admitting: Family Medicine

## 2019-01-31 VITALS — BP 130/68 | HR 70 | Temp 98.4°F | Ht 62.5 in | Wt 137.0 lb

## 2019-01-31 DIAGNOSIS — L249 Irritant contact dermatitis, unspecified cause: Secondary | ICD-10-CM | POA: Diagnosis not present

## 2019-01-31 DIAGNOSIS — E7800 Pure hypercholesterolemia, unspecified: Secondary | ICD-10-CM | POA: Diagnosis not present

## 2019-01-31 DIAGNOSIS — Z1231 Encounter for screening mammogram for malignant neoplasm of breast: Secondary | ICD-10-CM

## 2019-01-31 DIAGNOSIS — Z0000 Encounter for general adult medical examination without abnormal findings: Secondary | ICD-10-CM | POA: Diagnosis not present

## 2019-01-31 DIAGNOSIS — E2839 Other primary ovarian failure: Secondary | ICD-10-CM | POA: Diagnosis not present

## 2019-01-31 DIAGNOSIS — Z7952 Long term (current) use of systemic steroids: Secondary | ICD-10-CM | POA: Diagnosis not present

## 2019-01-31 NOTE — Patient Instructions (Addendum)
Work on low cholesterol diet.  Start red yeast rice 600 mg two capsules twice daily. Return for lipid check in 3 months to re-evaluate LDL.  We will call to set up mammogram and bone density.  Review complications from chronic steroid use. Do not stop prednisone suddenly.. will need to decrease very slowly to come off.  Have CMET, cbc and HgA1C checked  In 3 months, if stable can repeat every 6 months.  Continue bone density every 2 years. Take vit D3 daily and keep up calcium in diet.

## 2019-01-31 NOTE — Assessment & Plan Note (Signed)
Extensively reviewed longterm complications of chronic steroid. Paperwork provided to patient.  She states her quality of live is poor during flares and symptoms have not been managed by any MD with anything other than prednsione daily.  Also short  Bursts of prednsione are not effective for more than a few days. Opthamologist currently prescribing prednisone 5 mg but she wishes for me to take over the prescription.  She voiced understanding of all risks of this medication, but wishes to continue.

## 2019-01-31 NOTE — Progress Notes (Signed)
Subjective:    Patient ID: Andrea Mccall, female    DOB: 10/07/1951, 68 y.o.   MRN: [id number]  HPI  The patient presents for annual medicare wellness, complete physical and review of chronic health problems. He/She also has the following acute concerns today: chronic irritant dermatitis and pt request to stay on 5 mg prednisone long term  I have personally reviewed the Medicare Annual Wellness questionnaire and have noted 1. The patient's medical and social history 2. Their use of alcohol, tobacco or illicit drugs 3. Their current medications and supplements 4. The patient's functional ability including ADL's, fall risks, home safety risks and hearing or visual             impairment. 5. Diet and physical activities 6. Evidence for depression or mood disorders 7.         Updated provider list Cognitive evaluation was performed and recorded on pt medicare questionnaire form. The patients weight, height, BMI and visual acuity have been recorded in the chart  I have made referrals, counseling and provided education to the patient based review of the above and I have provided the pt with a written personalized care plan for preventive services.   Documentation of this information was scanned into the electronic record under the media tab.   Eyelid redess and rash.. has seen several DERM, rheum .Marland Kitchen not clear diagnosis.  08/2018 had bleoharoplasty and extropion repair. She has tried many topical steroids, She has severe flares at times.  Started on 5 mg prednisone daily now for 3 weeks ( started at 20 mg then tapered dow) per Dr. Brigitte Pulse ophthalmologic   Even with burst of prednisone when she stops symptoms resolve for only 3 -5 days.  She reports that she cannot sleep during flares given itching, buring.  Elevated Cholesterol:  LDL not at goal, AHA 8.6% 10 year risk of CVD Lab Results  Component Value Date   CHOL 228 (H) 01/09/2019   HDL 73.20 01/09/2019   LDLCALC 134 (H) 01/09/2019     LDLDIRECT 124.0 12/22/2009   TRIG 105.0 01/09/2019   CHOLHDL 3 01/09/2019  Using medications without problems: Muscle aches:  Diet compliance: good Exercise: walking Other complaints:   prediabetes   A1C at work was 5.6  She is currently seeing Dr. Chalmers Cater for subclinical hypothyroid.. TSH elevated and TPO elevated. Now on levothyroxine 50 mcg daily.   Advance directives and end of life planning reviewed in detail with patient and documented in EMR. Patient given handout on advance care directives if needed. HCPOA and living will updated if needed.  Hearing Screening   Method: Audiometry   125Hz  250Hz  500Hz  1000Hz  2000Hz  3000Hz  4000Hz  6000Hz  8000Hz   Right ear:   20 20 20  20     Left ear:   20 20 20  20     Vision Screening Comments: Wears Glasses.  Eye Exam with C. Manuella Ghazi at [facility] 12/19/2018.  Fall Risk  01/31/2019 01/03/2018 12/08/2016 11/11/2015  Falls in the past year? 0 Yes No No  Number falls in past yr: - 1 - -  Injury with Fall? - Yes - -      Office Visit from 01/31/2019 in [place] at [facility] Total Score  0     Social History /Family History/Past Medical History reviewed in detail and updated in EMR if needed. Blood pressure 130/68, pulse 70, temperature 98.4 F (36.9 C), temperature source Oral, height 5' 2.5" (1.588 m), weight 137 lb (  62.1 kg).  Review of Systems  Constitutional: Negative for fatigue and fever.  HENT: Negative for congestion.   Eyes: Negative for pain.  Respiratory: Negative for cough and shortness of breath.   Cardiovascular: Negative for chest pain, palpitations and leg swelling.  Gastrointestinal: Negative for abdominal pain.  Genitourinary: Negative for dysuria and vaginal bleeding.  Musculoskeletal: Negative for back pain.  Skin: Positive for rash.  Neurological: Negative for syncope, light-headedness and headaches.  Psychiatric/Behavioral: Negative for dysphoric mood.       Objective:   Physical  Exam Constitutional:      General: She is not in acute distress.    Appearance: Normal appearance. She is well-developed. She is not ill-appearing or toxic-appearing.  HENT:     Head: Normocephalic.     Right Ear: Hearing, tympanic membrane, ear canal and external ear normal.     Left Ear: Hearing, tympanic membrane, ear canal and external ear normal.     Nose: Nose normal.  Eyes:     General: Lids are normal. Lids are everted, no foreign bodies appreciated.     Conjunctiva/sclera: Conjunctivae normal.     Pupils: Pupils are equal, round, and reactive to light.  Neck:     Musculoskeletal: Normal range of motion and neck supple.     Thyroid: No thyroid mass or thyromegaly.     Vascular: No carotid bruit.     Trachea: Trachea normal.  Cardiovascular:     Rate and Rhythm: Normal rate and regular rhythm.     Heart sounds: Normal heart sounds, S1 normal and S2 normal. No murmur. No gallop.   Pulmonary:     Effort: Pulmonary effort is normal. No respiratory distress.     Breath sounds: Normal breath sounds. No wheezing, rhonchi or rales.  Abdominal:     General: Bowel sounds are normal. There is no distension or abdominal bruit.     Palpations: Abdomen is soft. There is no fluid wave or mass.     Tenderness: There is no abdominal tenderness. There is no guarding or rebound.     Hernia: No hernia is present.  Lymphadenopathy:     Cervical: No cervical adenopathy.  Skin:    General: Skin is warm and dry.     Findings: No rash.          Comments: Erythema , severe dry skin around eyes bilaterally, swollen tissues, dry flaky  areas  Neurological:     Mental Status: She is alert.     Cranial Nerves: No cranial nerve deficit.     Sensory: No sensory deficit.  Psychiatric:        Mood and Affect: Mood is not anxious or depressed.        Speech: Speech normal.        Behavior: Behavior normal. Behavior is cooperative.        Judgment: Judgment normal.           Assessment &  Plan:  The patient's preventative maintenance and recommended screening tests for an annual wellness exam were reviewed in full today. Brought up to date unless services declined.  Counselled on the importance of diet, exercise, and its role in overall health and mortality. The patient's FH and SH was reviewed, including their home life, tobacco status, and drug and alcohol status.    Vaccines:uptodate Pap/DVE:11/2014 [facility] Dr. Rogue Bussing: Had pelvic, breast. PAP not indicated after age 21. Mammo:01/2018 at [place].. order placed for this year Bone Density:  Vit D 3 and weight bearing.Marland Kitchen overdue.. needs q2 year given will be on prednisone. Colon:Dr. Carlean Purl 09/01/2015 4 mm adenoma, repeat in  7 years, in 2023 Smoking Status:former smoker, remote ETOH/ drug use:occ/none Hep C:done

## 2019-02-06 NOTE — Assessment & Plan Note (Signed)
Poorc otnrol and risk increased further with initiation of chronic steroids. Encouraged pt to start statin, she would first like to true diet changes. Lifestlye changes reviewed and information given to patient.

## 2019-02-06 NOTE — Assessment & Plan Note (Signed)
See note on irritant dermatitis

## 2019-02-21 ENCOUNTER — Telehealth: Payer: Self-pay

## 2019-02-21 MED ORDER — PREDNISONE 5 MG PO TABS: 5.0000 mg | ORAL_TABLET | Freq: Every day | ORAL | 1 refills | Status: DC

## 2019-02-21 NOTE — Telephone Encounter (Signed)
Pt left v/m; pt last seen 01/31/19 and pt understood that Dr Diona Browner would send in prednisone to [place] if requested.  Pt is requesting prednisone rx to [place]. Pt does not need cb.

## 2019-02-21 NOTE — Telephone Encounter (Signed)
Let pt know prednisone sent in for her

## 2019-03-27 ENCOUNTER — Ambulatory Visit: Payer: PPO

## 2019-03-27 ENCOUNTER — Other Ambulatory Visit: Payer: PPO

## 2019-04-01 ENCOUNTER — Other Ambulatory Visit: Payer: Self-pay | Admitting: Family Medicine

## 2019-04-01 NOTE — Telephone Encounter (Signed)
Last office visit 01/31/2019 for [place].  Last refilled 02/21/2019 for #30 with 1 refill.  Next Appt: 04/24/2019 for follow up chronic prednisone use.

## 2019-04-04 ENCOUNTER — Telehealth: Payer: Self-pay | Admitting: *Deleted

## 2019-04-04 NOTE — Telephone Encounter (Signed)
Patient called requesting a new script be sent to the pharmacy for Prednisone 10 mg. Patient stated that she had a flare-up with her eyelid and had to double up on her 5 mg. Patient stated that her insurance will not pay for her Prednisone 5 mg until 04/16/19. Patient stated that she is running out because of her doubling up. . Patient stated that the pharmacist told her to see if Dr. Diona Browner would send a new script in for Prednisone 10 mg.? Patient stated that she has about 4 days left. Pharmacy [facility]

## 2019-04-04 NOTE — Telephone Encounter (Signed)
Have her make virtual OV to  Re-eval need for additional prednisone on Tuesday.

## 2019-04-07 NOTE — Telephone Encounter (Signed)
Left message with spouse asking pt to call office

## 2019-04-07 NOTE — Telephone Encounter (Signed)
Left message on cell phone asking pt to call office

## 2019-04-07 NOTE — Telephone Encounter (Signed)
Appointment 4/28 Pt aware

## 2019-04-08 ENCOUNTER — Ambulatory Visit (INDEPENDENT_AMBULATORY_CARE_PROVIDER_SITE_OTHER): Payer: PPO | Admitting: Family Medicine

## 2019-04-08 ENCOUNTER — Other Ambulatory Visit: Payer: Self-pay

## 2019-04-08 ENCOUNTER — Encounter: Payer: Self-pay | Admitting: Family Medicine

## 2019-04-08 ENCOUNTER — Ambulatory Visit: Payer: PPO | Admitting: Family Medicine

## 2019-04-08 VITALS — Ht 62.5 in | Wt 138.0 lb

## 2019-04-08 DIAGNOSIS — L249 Irritant contact dermatitis, unspecified cause: Secondary | ICD-10-CM

## 2019-04-08 DIAGNOSIS — Z7952 Long term (current) use of systemic steroids: Secondary | ICD-10-CM | POA: Diagnosis not present

## 2019-04-08 DIAGNOSIS — E7800 Pure hypercholesterolemia, unspecified: Secondary | ICD-10-CM | POA: Diagnosis not present

## 2019-04-08 NOTE — Assessment & Plan Note (Signed)
Reviewed again complications of longterm systemic steroid. Pt continues to refuse referral  Back to derm for discussion of biologics/immunologics.  She wishes to continue use of prednisone low dose continuous with ability to increase to 10 mg daily for flares. She will continue to try to decrease use but she does not feel she can ever stop.   follow up on labs q3-6 months.

## 2019-04-08 NOTE — Assessment & Plan Note (Signed)
On red yeast rice.. due for re-eval. High risk for CVD given prednisone use.

## 2019-04-08 NOTE — Progress Notes (Signed)
VIRTUAL VISIT Due to national recommendations of social distancing due to [place] 19, a virtual visit is felt to be most appropriate for this patient at this time.   I connected with the patient on 04/08/19 at 11:40 AM EDT by virtual telehealth platform and verified that I am speaking with the correct person using two identifiers.   I discussed the limitations, risks, security and privacy concerns of performing an evaluation and management service by  virtual telehealth platform and the availability of in person appointments. I also discussed with the patient that there may be a patient responsible charge related to this service. The patient expressed understanding and agreed to proceed.  Patient location: Home Provider Location: [place] Participants: Andrea Mccall and Andrea Mccall   Chief Complaint  Patient presents with  . Dermatitis    chronic issue. Discuss need for prednisone again for her eye swelling.    History of Present Illness: 68 year old female presents with chronic dermatitis  Eyelid redess and rash.. has seen several DERM, rheum .Marland Kitchen not clear diagnosis.  08/2018 had bleoharoplasty and extropion repair. She has tried many topical steroids, She has severe flares at times. Started on 5 mg prednisone in early 01/2019 ( started at 20 mg then tapered dow) per Dr. Brigitte Pulse ophthalmologic She is not interested in biologics/immunologics to treat.  Even with burst of prednisone when she stops symptoms resolve for only 3 -5 days.  She reports that she cannot sleep during flares given itching, buring.  She was doing very well on 5 mg daily  For 9-10 weeks.  In last several weeks she had breakthrough itching and redness... she increased dose on her own to 10 mg daily ( stareted 10 mg on 2/27) She has continued this since then. [facility].  She has had significant improvement of symptoms in alst week.   No SE on higher dose of  prednisone.   She has had to reschedule DEXA. 06/2019.  COVID 19 screen No recent travel or known exposure to COVID19 The patient denies respiratory symptoms of COVID 19 at this time.  The importance of social distancing was discussed today.   Review of Systems  Constitutional: Negative for chills and fever.  HENT: Negative for congestion and ear pain.   Eyes: Negative for pain and redness.  Respiratory: Negative for cough and shortness of breath.   Cardiovascular: Negative for chest pain, palpitations and leg swelling.  Gastrointestinal: Negative for abdominal pain, blood in stool, constipation, diarrhea, nausea and vomiting.  Genitourinary: Negative for dysuria.  Musculoskeletal: Negative for falls and myalgias.  Skin: Negative for rash.  Neurological: Negative for dizziness.  Psychiatric/Behavioral: Negative for depression. The patient is not nervous/anxious.       Past Medical History:  Diagnosis Date  . Hepatitis B   . History of chicken pox   . Hx of adenomatous polyp of colon 09/08/2015  . Inflammations of eyelids    taking Methatrexate and Prednisone  . Thyroid disease 2010   Benign parathyroid tumor    reports that she quit smoking about 37 years ago. Her smoking use included cigarettes. She smoked 10.00 packs per day. She has never used smokeless tobacco. She reports that she does not drink alcohol or use drugs.   Current Outpatient Medications:  .  levothyroxine (SYNTHROID, LEVOTHROID) 50 MCG tablet, Take 50 mcg by mouth daily before breakfast., Disp: , Rfl:  .  predniSONE (DELTASONE) 5 MG  tablet, TAKE 1 TABLET BY MOUTH EVERY DAY, Disp: 30 tablet, Rfl: 1   Observations/Objective: Height 5' 2.5" (1.588 m), weight 138 lb (62.6 kg).  Physical Exam  Physical Exam Constitutional:      General: She is not in acute distress. Pulmonary:     Effort: Pulmonary effort is normal. No respiratory distress.  Neurological:     Mental Status: She is alert and oriented to  person, place, and time.  Psychiatric:        Mood and Affect: Mood normal.        Behavior: Behavior normal.  Skin: currently no rash on face on 10 mg daily.  Assessment and Plan Irritant dermatitis, chronic Reviewed again complications of longterm systemic steroid. Pt continues to refuse referral  Back to derm for discussion of biologics/immunologics.  She wishes to continue use of prednisone low dose continuous with ability to increase to 10 mg daily for flares. She will continue to try to decrease use but she does not feel she can ever stop.   follow up on labs q3-6 months.  High cholesterol On red yeast rice.. due for re-eval. High risk for CVD given prednisone use.  Current chronic use of systemic steroids Again reviewed longterm risks of chronic use of steroids. She states she cannot tolerate symptoms of med, has 0 quality of life, cannot sleep when not on prednisone.  She will have labs done at work and fax them to [place].   I discussed the assessment and treatment plan with the patient. The patient was provided an opportunity to ask questions and all were answered. The patient agreed with the plan and demonstrated an understanding of the instructions.   The patient was advised to call back or seek an in-person evaluation if the symptoms worsen or if the condition fails to improve as anticipated.     Andrea Lofts, MD

## 2019-04-08 NOTE — Assessment & Plan Note (Signed)
Again reviewed longterm risks of chronic use of steroids. She states she cannot tolerate symptoms of med, has 0 quality of life, cannot sleep when not on prednisone.

## 2019-04-24 ENCOUNTER — Ambulatory Visit: Payer: PPO | Admitting: Family Medicine

## 2019-05-06 DIAGNOSIS — Z8619 Personal history of other infectious and parasitic diseases: Secondary | ICD-10-CM | POA: Diagnosis not present

## 2019-05-06 DIAGNOSIS — E039 Hypothyroidism, unspecified: Secondary | ICD-10-CM | POA: Diagnosis not present

## 2019-05-06 DIAGNOSIS — Z8709 Personal history of other diseases of the respiratory system: Secondary | ICD-10-CM | POA: Diagnosis not present

## 2019-05-06 DIAGNOSIS — Z03818 Encounter for observation for suspected exposure to other biological agents ruled out: Secondary | ICD-10-CM | POA: Diagnosis not present

## 2019-05-06 DIAGNOSIS — E7800 Pure hypercholesterolemia, unspecified: Secondary | ICD-10-CM | POA: Diagnosis not present

## 2019-05-06 DIAGNOSIS — Z79899 Other long term (current) drug therapy: Secondary | ICD-10-CM | POA: Diagnosis not present

## 2019-05-20 ENCOUNTER — Telehealth: Payer: Self-pay | Admitting: *Deleted

## 2019-05-20 MED ORDER — PREDNISONE 5 MG PO TABS: 5.0000 mg | ORAL_TABLET | Freq: Every day | ORAL | 2 refills | Status: DC

## 2019-05-20 NOTE — Telephone Encounter (Signed)
Patient left a voicemail stating that she is faxing you over some lab work that was done at her work. Patient stated that this was done to get a refill on her Prednisone. Patient stated that she has about a weeks worth of Prednisone left and she will be going out of town the end of June. Patient requested a call back to let her know that you received the lab work and that her Prednisone is being refilled. [facility]

## 2019-05-20 NOTE — Telephone Encounter (Signed)
Left message for Andrea Mccall that Dr. Diona Browner reviewed her labs and has [facility] [address].

## 2019-05-20 NOTE — Telephone Encounter (Signed)
Reviewed labs. Sent in prescription.

## 2019-07-03 ENCOUNTER — Other Ambulatory Visit: Payer: PPO

## 2019-07-03 ENCOUNTER — Ambulatory Visit: Payer: PPO

## 2019-08-04 ENCOUNTER — Other Ambulatory Visit: Payer: Self-pay

## 2019-08-04 NOTE — Telephone Encounter (Signed)
Pt left v/m requesting refill on prednisone [facility] mg [facility] [address]. 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 Pt said no one is sure what is causing the problem (Shaniah said Dr Diona Browner is aware of dermatology needs; Itchy eyebrows; had virtual visit and discussed prednisone.pt thinks needs to be on prednisone 5 mg taking 2 tabs daily for 1 wk and then will go back on 5 mg daily.. Last filled prednisone 5 mg # 30 x 2 on 05/20/19. Pt will fax current lab results to Dr Diona Browner  at  [phone] at [facility] desk). Pt request cb after Dr Diona Browner reviews and sends med to pharmacy.

## 2019-08-05 MED ORDER — PREDNISONE 5 MG PO TABS: 5.0000 mg | ORAL_TABLET | Freq: Every day | ORAL | 2 refills | Status: DC

## 2019-08-05 NOTE — Telephone Encounter (Signed)
Andrea Mccall notified by telephone that her prescription refill for prednisone has been sent to her pharmacy.

## 2019-08-14 ENCOUNTER — Ambulatory Visit
Admission: RE | Admit: 2019-08-14 | Discharge: 2019-08-14 | Disposition: A | Discharge status: Home / Self Care | Payer: PPO | Source: Ambulatory Visit | Attending: Family Medicine | Admitting: Family Medicine

## 2019-08-14 ENCOUNTER — Encounter: Payer: Self-pay | Admitting: Family Medicine

## 2019-08-14 ENCOUNTER — Encounter: Payer: Self-pay | Admitting: *Deleted

## 2019-08-14 ENCOUNTER — Other Ambulatory Visit: Payer: Self-pay

## 2019-08-14 DIAGNOSIS — M8580 Other specified disorders of bone density and structure, unspecified site: Secondary | ICD-10-CM | POA: Insufficient documentation

## 2019-08-14 DIAGNOSIS — Z1231 Encounter for screening mammogram for malignant neoplasm of breast: Secondary | ICD-10-CM | POA: Diagnosis not present

## 2019-08-14 DIAGNOSIS — M8589 Other specified disorders of bone density and structure, multiple sites: Secondary | ICD-10-CM | POA: Diagnosis not present

## 2019-08-14 DIAGNOSIS — E2839 Other primary ovarian failure: Secondary | ICD-10-CM

## 2019-08-14 DIAGNOSIS — Z780 Asymptomatic menopausal state: Secondary | ICD-10-CM | POA: Diagnosis not present

## 2019-10-11 ENCOUNTER — Encounter: Payer: Self-pay | Admitting: Emergency Medicine

## 2019-10-11 ENCOUNTER — Other Ambulatory Visit: Payer: Self-pay

## 2019-10-11 ENCOUNTER — Emergency Department
Admission: EM | Admit: 2019-10-11 | Discharge: 2019-10-11 | Disposition: A | Discharge status: Home / Self Care | Payer: PPO | Attending: Student | Admitting: Student

## 2019-10-11 ENCOUNTER — Emergency Department: Payer: PPO

## 2019-10-11 DIAGNOSIS — Y999 Unspecified external cause status: Secondary | ICD-10-CM | POA: Diagnosis not present

## 2019-10-11 DIAGNOSIS — W010XXA Fall on same level from slipping, tripping and stumbling without subsequent striking against object, initial encounter: Secondary | ICD-10-CM | POA: Insufficient documentation

## 2019-10-11 DIAGNOSIS — S42212A Unspecified displaced fracture of surgical neck of left humerus, initial encounter for closed fracture: Secondary | ICD-10-CM | POA: Diagnosis not present

## 2019-10-11 DIAGNOSIS — Y939 Activity, unspecified: Secondary | ICD-10-CM | POA: Diagnosis not present

## 2019-10-11 DIAGNOSIS — S4992XA Unspecified injury of left shoulder and upper arm, initial encounter: Secondary | ICD-10-CM | POA: Diagnosis present

## 2019-10-11 DIAGNOSIS — Z79899 Other long term (current) drug therapy: Secondary | ICD-10-CM | POA: Insufficient documentation

## 2019-10-11 DIAGNOSIS — Z87891 Personal history of nicotine dependence: Secondary | ICD-10-CM | POA: Diagnosis not present

## 2019-10-11 DIAGNOSIS — Y929 Unspecified place or not applicable: Secondary | ICD-10-CM | POA: Diagnosis not present

## 2019-10-11 MED ORDER — HYDROCODONE-ACETAMINOPHEN 5-325 MG PO TABS: 1.0000 | ORAL_TABLET | Freq: Four times a day (QID) | ORAL | 0 refills | Status: DC | PRN

## 2019-10-11 NOTE — ED Notes (Signed)
See triage note  Presents s/p fall  States she fell this am  Landed on left shoulder  Having pain to left upper arm

## 2019-10-11 NOTE — Discharge Instructions (Addendum)
Call Dr. Harden Mo office on Monday for an appointment.  Apply ice to the left shoulder as frequently as possible.  Wear the sling consistently.  You may remove it only to get in the shower.  You may take Tylenol and ibuprofen for pain throughout the day.  Vicodin for pain at night.  Return if worsening.  Return if he cannot feel your hand or if your hand feels cold.

## 2019-10-11 NOTE — ED Triage Notes (Signed)
Pt reports her shoe got stuck on something on floor and she tripped.  Pt landed on left arm, pain to humerus.  No head injury.  No blood thinners.

## 2019-10-11 NOTE — ED Provider Notes (Addendum)
[facility] Emergency Department Provider Note  ____________________________________________   First MD Initiated Contact with Patient 10/11/19 1047     (approximate)  I have reviewed the triage vital signs and the nursing notes.   HISTORY  Chief Complaint Fall    HPI Andrea Mccall is a 68 y.o. female presents emergency department complaining of left arm pain.  She states she fell landed on the left upper arm.  Pain is at shoulder.  She cannot move the shoulder.  States feels like something is spasming and pulling like a muscle.  She did not hit her head.  No neck injury.  No LOC.  No chest pain or shortness of breath.    Past Medical History:  Diagnosis Date   Hepatitis B    History of chicken pox    Hx of adenomatous polyp of colon 09/08/2015   Inflammations of eyelids    taking Methatrexate and Prednisone   Thyroid disease 2010   Benign parathyroid tumor    Patient Active Problem List   Diagnosis Date Noted   Osteopenia 08/14/2019   Current chronic use of systemic steroids 01/31/2019   Counseling regarding end of life decision making 11/11/2015   Hx of adenomatous polyp of colon 09/08/2015   Irritant dermatitis, chronic 01/21/2015   Hx of type B viral hepatitis 01/21/2015   High cholesterol 01/21/2015    Past Surgical History:  Procedure Laterality Date   BREAST BIOPSY Right 1990s   benign   BREAST SURGERY     CHOLECYSTECTOMY     COLONOSCOPY     TONSILLECTOMY AND ADENOIDECTOMY      Prior to Admission medications   Medication Sig Start Date End Date Taking? Authorizing Provider  HYDROcodone-acetaminophen (NORCO/VICODIN) 5-325 MG tablet Take 1 tablet by mouth every 6 (six) hours as needed for moderate pain. 10/11/19   Jonell Krontz, Linden Dolin, PA-C  levothyroxine (SYNTHROID, LEVOTHROID) 50 MCG tablet Take 50 mcg by mouth daily before breakfast.    [provider]  predniSONE (DELTASONE) 5 MG tablet Take 1-2  tablets (5-10 mg total) by mouth daily. Use lowest dose able 08/05/19   Bedsole, Amy E, MD    Allergies Penicillins and Sulfa antibiotics  Family History  Problem Relation Age of Onset   COPD Mother    Alcohol abuse Mother    Hyperlipidemia Mother    Hypertension Mother    Heart failure Father    Hypertension Father    Diabetes Sister    Heart disease Sister    Heart murmur Sister    Alcohol abuse Maternal Grandfather    Heart murmur Sister    Arthritis Paternal Uncle    Lung cancer Maternal Aunt    Colon cancer Neg Hx    Colon polyps Neg Hx    Esophageal cancer Neg Hx    Stomach cancer Neg Hx    Rectal cancer Neg Hx     Social History Social History   Tobacco Use   Smoking status: Former Smoker    Packs/day: 10.00    Types: Cigarettes    Quit date: 04/19/1981    Years since quitting: 38.5   Smokeless tobacco: Never Used  Substance Use Topics   Alcohol use: No    Alcohol/week: 0.0 standard drinks    Comment: occasionally wine   Drug use: No    Review of Systems  Constitutional: No fever/chills Eyes: No visual changes. ENT: No sore throat. Respiratory: Denies cough Genitourinary: Negative for dysuria. Musculoskeletal: Negative for  back pain.  Positive left shoulder pain Skin: Negative for rash.    ____________________________________________   PHYSICAL EXAM:  VITAL SIGNS: ED Triage Vitals  Enc Vitals Group     BP 10/11/19 1024 (!) 190/72     Pulse Rate 10/11/19 1024 72     Resp 10/11/19 1024 18     Temp 10/11/19 1024 97.8 F (36.6 C)     Temp Source 10/11/19 1024 Oral     SpO2 10/11/19 1024 98 %     Weight 10/11/19 1030 140 lb (63.5 kg)     Height 10/11/19 1030 5\' 3"  (1.6 m)     Head Circumference --      Peak Flow --      Pain Score --      Pain Loc --      Pain Edu? --      Excl. in [place]? --     Constitutional: Alert and oriented. Well appearing and in no acute distress. Eyes: Conjunctivae are normal.  Head:  Atraumatic. Nose: No congestion/rhinnorhea. Mouth/Throat: Mucous membranes are moist.   Neck:  supple no lymphadenopathy noted Cardiovascular: Normal rate, regular rhythm. Respiratory: Normal respiratory effort.  No retractions,  GU: deferred Musculoskeletal: Left shoulder has positive deformity at the upper aspect.  Decreased range of motion.  Neurovascular is intact  neurologic:  Normal speech and language.  Skin:  Skin is warm, dry and intact. No rash noted. Psychiatric: Mood and affect are normal. Speech and behavior are normal.  ____________________________________________   LABS (all labs ordered are listed, but only abnormal results are displayed)  Labs Reviewed - No data to display ____________________________________________   ____________________________________________  RADIOLOGY  X-ray of the left shoulder shows a humeral neck fracture  ____________________________________________   PROCEDURES  Procedure(s) performed: sling  Procedures    ____________________________________________   INITIAL IMPRESSION / ASSESSMENT AND PLAN / ED COURSE  Pertinent labs & imaging results that were available during my care of the patient were reviewed by me and considered in my medical decision making (see chart for details).   Patient is 68 year old female presents emergency department after a fall.  No head injury.  See HPI Physical exam shows a large amount of swelling questionable deformity at the proximal aspect of the humerus.  X-ray shows a humeral neck fracture.  Does not appear to be grossly displaced.  Patient was advised of the x-ray results.  She was placed in a sling given ice pack.  Pain medication for at night as she does not want to take it during the day.  She is to call Dr. Harden Mo office on Monday for an appointment.  She is to wear the sling until evaluated by orthopedics.  Advised her she may remove it just to shower but should return to the sling  immediately.  She states she understands will comply.  She was discharged in stable condition.    Andrea Mccall was evaluated in Emergency Department on 10/11/2019 for the symptoms described in the history of present illness. She was evaluated in the context of the global COVID-19 pandemic, which necessitated consideration that the patient might be at risk for infection with the SARS-CoV-2 virus that causes COVID-19. Institutional protocols and algorithms that pertain to the evaluation of patients at risk for COVID-19 are in a state of rapid change based on information released by regulatory bodies including the CDC and federal and state organizations. These policies and algorithms were followed during the patient's care in the ED.  As part of my medical decision making, I reviewed the following data within the [place] notes reviewed and incorporated, Old chart reviewed, Radiograph reviewed x-ray left shoulder shows a humeral neck fracture., Notes from prior ED visits and  Controlled Substance Database  ____________________________________________   FINAL CLINICAL IMPRESSION(S) / ED DIAGNOSES  Final diagnoses:  Closed displaced fracture of surgical neck of left humerus, unspecified fracture morphology, initial encounter      NEW MEDICATIONS STARTED DURING THIS VISIT:  New Prescriptions   HYDROCODONE-ACETAMINOPHEN (NORCO/VICODIN) 5-325 MG TABLET    Take 1 tablet by mouth every 6 (six) hours as needed for moderate pain.     Note:  This document was prepared using Dragon voice recognition software and may include unintentional dictation errors.    Versie Starks, PA-C 10/11/19 1140    Versie Starks, PA-C 10/11/19 1544    Lilia Pro., MD 10/11/19 2043

## 2019-10-11 NOTE — ED Notes (Signed)
First Nurse Note: Pt to ED s/p fall. Pt is having pain in her left arm. Pt is in NAD, ambulatory into ED.

## 2019-10-13 DIAGNOSIS — S42209A Unspecified fracture of upper end of unspecified humerus, initial encounter for closed fracture: Secondary | ICD-10-CM | POA: Insufficient documentation

## 2019-10-13 DIAGNOSIS — S42212A Unspecified displaced fracture of surgical neck of left humerus, initial encounter for closed fracture: Secondary | ICD-10-CM | POA: Diagnosis not present

## 2019-10-27 DIAGNOSIS — S42202D Unspecified fracture of upper end of left humerus, subsequent encounter for fracture with routine healing: Secondary | ICD-10-CM | POA: Diagnosis not present

## 2019-10-30 ENCOUNTER — Ambulatory Visit (INDEPENDENT_AMBULATORY_CARE_PROVIDER_SITE_OTHER): Payer: PPO

## 2019-10-30 DIAGNOSIS — M25612 Stiffness of left shoulder, not elsewhere classified: Secondary | ICD-10-CM | POA: Diagnosis not present

## 2019-10-30 DIAGNOSIS — M25512 Pain in left shoulder: Secondary | ICD-10-CM | POA: Diagnosis not present

## 2019-10-30 DIAGNOSIS — Z23 Encounter for immunization: Secondary | ICD-10-CM | POA: Diagnosis not present

## 2019-11-03 ENCOUNTER — Other Ambulatory Visit: Payer: Self-pay

## 2019-11-03 MED ORDER — PREDNISONE 5 MG PO TABS: 5.0000 mg | ORAL_TABLET | Freq: Every day | ORAL | 0 refills | Status: DC

## 2019-11-03 NOTE — Telephone Encounter (Signed)
Refilled times one in PCP absence

## 2019-11-03 NOTE — Telephone Encounter (Signed)
Patient called asking for a refill on Prednisone. She takes this chronically. Last filled on 08/05/2019 #60 with 2 refills.  LOV 04/08/2019 for follow up Next appointment on 02/13/2020 for CPE  Dr Diona Browner is out of the office this week.

## 2019-11-05 NOTE — Telephone Encounter (Signed)
Pt called to see if prednisone was sent to [facility]; advised sent electronically on 11/03/19. Pt voiced understanding.

## 2019-11-10 DIAGNOSIS — M25612 Stiffness of left shoulder, not elsewhere classified: Secondary | ICD-10-CM | POA: Diagnosis not present

## 2019-11-10 DIAGNOSIS — S42202D Unspecified fracture of upper end of left humerus, subsequent encounter for fracture with routine healing: Secondary | ICD-10-CM | POA: Diagnosis not present

## 2019-11-10 DIAGNOSIS — M25512 Pain in left shoulder: Secondary | ICD-10-CM | POA: Diagnosis not present

## 2019-11-12 DIAGNOSIS — M25612 Stiffness of left shoulder, not elsewhere classified: Secondary | ICD-10-CM | POA: Diagnosis not present

## 2019-11-12 DIAGNOSIS — M25512 Pain in left shoulder: Secondary | ICD-10-CM | POA: Diagnosis not present

## 2019-11-17 DIAGNOSIS — M25612 Stiffness of left shoulder, not elsewhere classified: Secondary | ICD-10-CM | POA: Diagnosis not present

## 2019-11-17 DIAGNOSIS — M25512 Pain in left shoulder: Secondary | ICD-10-CM | POA: Diagnosis not present

## 2019-11-19 DIAGNOSIS — M25612 Stiffness of left shoulder, not elsewhere classified: Secondary | ICD-10-CM | POA: Diagnosis not present

## 2019-11-19 DIAGNOSIS — M25512 Pain in left shoulder: Secondary | ICD-10-CM | POA: Diagnosis not present

## 2019-11-24 DIAGNOSIS — M25512 Pain in left shoulder: Secondary | ICD-10-CM | POA: Diagnosis not present

## 2019-11-24 DIAGNOSIS — M25612 Stiffness of left shoulder, not elsewhere classified: Secondary | ICD-10-CM | POA: Diagnosis not present

## 2019-11-26 DIAGNOSIS — M25612 Stiffness of left shoulder, not elsewhere classified: Secondary | ICD-10-CM | POA: Diagnosis not present

## 2019-11-26 DIAGNOSIS — M25512 Pain in left shoulder: Secondary | ICD-10-CM | POA: Diagnosis not present

## 2019-11-27 ENCOUNTER — Other Ambulatory Visit: Payer: Self-pay | Admitting: Family Medicine

## 2019-12-02 DIAGNOSIS — M25612 Stiffness of left shoulder, not elsewhere classified: Secondary | ICD-10-CM | POA: Diagnosis not present

## 2019-12-02 DIAGNOSIS — M25512 Pain in left shoulder: Secondary | ICD-10-CM | POA: Diagnosis not present

## 2019-12-10 DIAGNOSIS — M25512 Pain in left shoulder: Secondary | ICD-10-CM | POA: Diagnosis not present

## 2019-12-10 DIAGNOSIS — M25612 Stiffness of left shoulder, not elsewhere classified: Secondary | ICD-10-CM | POA: Diagnosis not present

## 2019-12-17 DIAGNOSIS — S42202D Unspecified fracture of upper end of left humerus, subsequent encounter for fracture with routine healing: Secondary | ICD-10-CM | POA: Diagnosis not present

## 2019-12-24 ENCOUNTER — Other Ambulatory Visit: Payer: Self-pay | Admitting: Family Medicine

## 2019-12-24 NOTE — Telephone Encounter (Signed)
Last office visit 04/08/2019 for chronic use of steroids, irritant dermatitis & high cholesterol.  Last refilled 11/27/2019 for #60 with no refills.  CPE scheduled 02/13/2020.

## 2019-12-26 DIAGNOSIS — Z03818 Encounter for observation for suspected exposure to other biological agents ruled out: Secondary | ICD-10-CM | POA: Diagnosis not present

## 2020-01-01 DIAGNOSIS — M25612 Stiffness of left shoulder, not elsewhere classified: Secondary | ICD-10-CM | POA: Diagnosis not present

## 2020-01-01 DIAGNOSIS — M25512 Pain in left shoulder: Secondary | ICD-10-CM | POA: Diagnosis not present

## 2020-01-05 ENCOUNTER — Telehealth: Payer: Self-pay | Admitting: *Deleted

## 2020-01-05 MED ORDER — PREDNISONE 5 MG PO TABS: 5.0000 mg | ORAL_TABLET | Freq: Every day | ORAL | 2 refills | Status: DC

## 2020-01-05 NOTE — Telephone Encounter (Signed)
Patient left a voicemail stating that she has about one weeks worth of Prednisone left. Patient stated that she is requesting a refill on her Prednisone. Patient stated that she will be faxing over to you the recent lab work that she had done at her work. [facility]

## 2020-01-07 NOTE — Telephone Encounter (Signed)
Damyra notified by telephone that [facility] [address] on [facility].

## 2020-01-07 NOTE — Telephone Encounter (Signed)
Pt left v/m requesting cb about status of Prednisone refill. Pt has faxed over lab results.[facility].

## 2020-01-16 DIAGNOSIS — S42202D Unspecified fracture of upper end of left humerus, subsequent encounter for fracture with routine healing: Secondary | ICD-10-CM | POA: Diagnosis not present

## 2020-02-11 ENCOUNTER — Telehealth: Payer: Self-pay | Admitting: Family Medicine

## 2020-02-11 DIAGNOSIS — E7800 Pure hypercholesterolemia, unspecified: Secondary | ICD-10-CM

## 2020-02-11 NOTE — Telephone Encounter (Signed)
-----   Message from Ellamae Sia sent at 02/03/2020  9:41 AM EST ----- Regarding: Lab orders for Thursday,  3.4.21 Patient is scheduled for CPX labs, please order future labs, Thanks , Karna Christmas

## 2020-02-12 ENCOUNTER — Ambulatory Visit: Payer: PPO

## 2020-02-12 ENCOUNTER — Other Ambulatory Visit (INDEPENDENT_AMBULATORY_CARE_PROVIDER_SITE_OTHER): Payer: PPO

## 2020-02-12 ENCOUNTER — Other Ambulatory Visit: Payer: Self-pay

## 2020-02-12 ENCOUNTER — Telehealth: Payer: Self-pay

## 2020-02-12 DIAGNOSIS — E7800 Pure hypercholesterolemia, unspecified: Secondary | ICD-10-CM | POA: Diagnosis not present

## 2020-02-12 LAB — COMPREHENSIVE METABOLIC PANEL
ALT: 13 U/L (ref 0–35)
AST: 20 U/L (ref 0–37)
Albumin: 3.8 g/dL (ref 3.5–5.2)
Alkaline Phosphatase: 68 U/L (ref 39–117)
BUN: 18 mg/dL (ref 6–23)
CO2: 28 mEq/L (ref 19–32)
Calcium: 9.4 mg/dL (ref 8.4–10.5)
Chloride: 104 mEq/L (ref 96–112)
Creatinine, Ser: 0.92 mg/dL (ref 0.40–1.20)
GFR: 60.27 mL/min (ref >60.00)
Glucose, Bld: 96 mg/dL (ref 70–99)
Potassium: 3.8 mEq/L (ref 3.5–5.1)
Sodium: 140 mEq/L (ref 135–145)
Total Bilirubin: 0.5 mg/dL (ref 0.2–1.2)
Total Protein: 6.5 g/dL (ref 6.0–8.3)

## 2020-02-12 LAB — LIPID PANEL
Cholesterol: 191 mg/dL (ref 0–200)
HDL: 63 mg/dL (ref >39.00)
LDL Cholesterol: 104 mg/dL — ABNORMAL HIGH (ref 0–99)
NonHDL: 128.17
Total CHOL/HDL Ratio: 3
Triglycerides: 119 mg/dL (ref 0.0–149.0)
VLDL: 23.8 mg/dL (ref 0.0–40.0)

## 2020-02-12 NOTE — Progress Notes (Signed)
No critical labs need to be addressed urgently. We will discuss labs in detail at upcoming office visit.   

## 2020-02-12 NOTE — Telephone Encounter (Signed)
FYI- Called patient to complete her Medicare visit. Patient requested today's appointment be cancelled due to the fact that she is having a dental emergency and has an appointment with her dentist at 10:45 am. Patient wants to have visit completed when she comes in for her physical with the doctor tomorrow.

## 2020-02-12 NOTE — Telephone Encounter (Signed)
Noted  

## 2020-02-13 ENCOUNTER — Other Ambulatory Visit: Payer: Self-pay

## 2020-02-13 ENCOUNTER — Ambulatory Visit (INDEPENDENT_AMBULATORY_CARE_PROVIDER_SITE_OTHER): Payer: PPO | Admitting: Family Medicine

## 2020-02-13 ENCOUNTER — Encounter: Payer: Self-pay | Admitting: Family Medicine

## 2020-02-13 VITALS — BP 150/70 | HR 76 | Temp 98.8°F | Ht 62.5 in | Wt 148.8 lb

## 2020-02-13 DIAGNOSIS — B370 Candidal stomatitis: Secondary | ICD-10-CM | POA: Diagnosis not present

## 2020-02-13 DIAGNOSIS — R030 Elevated blood-pressure reading, without diagnosis of hypertension: Secondary | ICD-10-CM

## 2020-02-13 DIAGNOSIS — Z0000 Encounter for general adult medical examination without abnormal findings: Secondary | ICD-10-CM

## 2020-02-13 DIAGNOSIS — L249 Irritant contact dermatitis, unspecified cause: Secondary | ICD-10-CM | POA: Diagnosis not present

## 2020-02-13 DIAGNOSIS — M8580 Other specified disorders of bone density and structure, unspecified site: Secondary | ICD-10-CM | POA: Diagnosis not present

## 2020-02-13 DIAGNOSIS — E7800 Pure hypercholesterolemia, unspecified: Secondary | ICD-10-CM

## 2020-02-13 DIAGNOSIS — E039 Hypothyroidism, unspecified: Secondary | ICD-10-CM | POA: Diagnosis not present

## 2020-02-13 DIAGNOSIS — R7303 Prediabetes: Secondary | ICD-10-CM | POA: Diagnosis not present

## 2020-02-13 DIAGNOSIS — Z7952 Long term (current) use of systemic steroids: Secondary | ICD-10-CM | POA: Diagnosis not present

## 2020-02-13 MED ORDER — ALENDRONATE SODIUM 70 MG PO TABS: 70.0000 mg | ORAL_TABLET | ORAL | 11 refills | Status: DC

## 2020-02-13 MED ORDER — NYSTATIN 100000 UNIT/ML MT SUSP: 5.0000 mL | Freq: Four times a day (QID) | OROMUCOSAL | 0 refills | Status: DC

## 2020-02-13 NOTE — Patient Instructions (Addendum)
Follow BPs next week at work.. goal < 140/90. Send in measurements by MyChart.   Work on low salt heart health diet.  Restart red yeast rice.

## 2020-02-13 NOTE — Progress Notes (Signed)
Chief Complaint  Patient presents with  . Medicare Wellness    History of Present Illness: HPI   The patient presents for annual medicare wellness, complete physical and review of chronic health problems. He/She also has the following acute concerns today: none  I have personally reviewed the Medicare Annual Wellness questionnaire and have noted 1. The patient's medical and social history 2. Their use of alcohol, tobacco or illicit drugs 3. Their current medications and supplements 4. The patient's functional ability including ADL's, fall risks, home safety risks and hearing or visual             impairment. 5. Diet and physical activities 6. Evidence for depression or mood disorders 7.         Updated provider list Cognitive evaluation was performed and recorded on pt medicare questionnaire form. The patients weight, height, BMI and visual acuity have been recorded in the chart  I have made referrals, counseling and provided education to the patient based review of the above and I have provided the pt with a written personalized care plan for preventive services.    Documentation of this information was scanned into the electronic record under the media tab.  Fall Risk  02/13/2020 01/31/2019 01/03/2018 12/08/2016 11/11/2015  Falls in the past year? 1 0 Yes No No  Number falls in past yr: 0 - 1 - -  Injury with Fall? 1 - Yes - -     Hearing Screening   Method: Audiometry   125Hz  250Hz  500Hz  1000Hz  2000Hz  3000Hz  4000Hz  6000Hz  8000Hz   Right ear:   20 20 20  20     Left ear:   20 20 20  20     Vision Screening Comments: Wears Glasses-Eye Exam 07/2019 with Dr.Roney at [facility] in [place].    Advance directives and end of life planning reviewed in detail with patient and documented in EMR. Patient given handout on advance care directives if needed. HCPOA and living will updated if needed. .   Office Visit from 02/13/2020 in [place] at [facility] Total Score  0        [place] in last year.. fracture in humerus.. no surgery indicated.  She is on chronic prednisone.Marland Kitchen osteopenia on recent DEXA...  on Vit D, walking daily  Elevated Cholesterol:  Good control on red yeast rice. Lab Results  Component Value Date   CHOL 191 02/12/2020   HDL 63.00 02/12/2020   LDLCALC 104 (H) 02/12/2020   LDLDIRECT 124.0 12/22/2009   TRIG 119.0 02/12/2020   CHOLHDL 3 02/12/2020  Using medications without problems: none Muscle aches: none Diet compliance: low chol diuet Exercise: Other complaints:  prediabetes improved.Marland Kitchen glucose 96.  A1C 5.9.  Hypothyroid  Stable on synthroid  At work TSH 2.255 Lab Results  Component Value Date   TSH 3.01 11/09/2016    Elevated Blood pressure.. not sure if due to chronic prednisone.. possibly mom with HTN BP Readings from Last 3 Encounters:  02/13/20 (!) 150/70  10/11/19 (!) 190/72  01/31/19 130/68    Irritation in mouth: B12 nml at work   On chronic low dose prednisone for dermatitis 5 mg does not seem to be controlling  Symptoms.  She missed a couple doses and rash return in few days severely.  This visit occurred during the SARS-CoV-2 public health emergency.  Safety protocols were in place, including screening questions prior to the visit, additional usage of staff PPE, and extensive cleaning of exam room while observing appropriate contact  time as indicated for disinfecting solutions.   COVID 19 screen:  No recent travel or known exposure to COVID19 The patient denies respiratory symptoms of COVID 19 at this time. The importance of social distancing was discussed today.     Review of Systems  Constitutional: Negative for chills and fever.  HENT: Negative for congestion and ear pain.   Eyes: Negative for pain and redness.  Respiratory: Negative for cough and shortness of breath.   Cardiovascular: Negative for chest pain, palpitations and leg swelling.  Gastrointestinal: Negative for abdominal pain, blood in  stool, constipation, diarrhea, nausea and vomiting.  Genitourinary: Negative for dysuria.  Musculoskeletal: Negative for falls and myalgias.  Skin: Negative for rash.  Neurological: Negative for dizziness.  Psychiatric/Behavioral: Negative for depression. The patient is not nervous/anxious.       Past Medical History:  Diagnosis Date  . Hepatitis B   . History of chicken pox   . Hx of adenomatous polyp of colon 09/08/2015  . Inflammations of eyelids    taking Methatrexate and Prednisone  . 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 Her smoking use included cigarettes. She smoked 10.00 packs per day. She has never used smokeless tobacco. She reports that she does not drink alcohol or use drugs.   Current Outpatient Medications:  .  HYDROcodone-acetaminophen (NORCO/VICODIN) 5-325 MG tablet, Take 1 tablet by mouth every 6 (six) hours as needed for moderate pain., Disp: 15 tablet, Rfl: 0 .  levothyroxine (SYNTHROID, LEVOTHROID) 50 MCG tablet, Take 50 mcg by mouth daily before breakfast., Disp: , Rfl:  .  predniSONE (DELTASONE) 5 MG tablet, Take 1-2 tablets (5-10 mg total) by mouth daily. Use lowest dose able, Disp: 60 tablet, Rfl: 2   Observations/Objective: Blood pressure (!) 150/70, pulse 76, temperature 98.8 F (37.1 C), temperature source Temporal, height 5' 2.5" (1.588 m), weight 148 lb 12 oz (67.5 kg), SpO2 99 %.  Physical Exam Constitutional:      General: She is not in acute distress.    Appearance: Normal appearance. She is well-developed. She is not ill-appearing or toxic-appearing.     Comments:  Round moon facies and swelling at upper back/ posterior neck from chronic steroids  HENT:     Head: Normocephalic.     Right Ear: Hearing, tympanic membrane, ear canal and external ear normal. Tympanic membrane is not erythematous, retracted or bulging.     Left Ear: Hearing, tympanic membrane, ear canal and external ear  normal. Tympanic membrane is not erythematous, retracted or bulging.     Nose: Nose normal. No mucosal edema or rhinorrhea.     Right Sinus: No maxillary sinus tenderness or frontal sinus tenderness.     Left Sinus: No maxillary sinus tenderness or frontal sinus tenderness.     Mouth/Throat:     Pharynx: Uvula midline.     Comments: White plaque on tounge Eyes:     General: Lids are normal. Lids are everted, no foreign bodies appreciated.     Conjunctiva/sclera: Conjunctivae normal.     Pupils: Pupils are equal, round, and reactive to light.  Neck:     Thyroid: No thyroid mass or thyromegaly.     Vascular: No carotid bruit.     Trachea: Trachea normal.  Cardiovascular:     Rate and Rhythm: Normal rate and regular rhythm.     Pulses: Normal pulses.     Heart sounds: Normal heart sounds, S1  normal and S2 normal. No murmur. No friction rub. No gallop.   Pulmonary:     Effort: Pulmonary effort is normal. No tachypnea or respiratory distress.     Breath sounds: Normal breath sounds. No decreased breath sounds, wheezing, rhonchi or rales.  Abdominal:     General: Bowel sounds are normal. There is no distension or abdominal bruit.     Palpations: Abdomen is soft. There is no fluid wave or mass.     Tenderness: There is no abdominal tenderness. There is no guarding or rebound.     Hernia: No hernia is present.  Musculoskeletal:     Cervical back: Normal range of motion and neck supple.  Lymphadenopathy:     Cervical: No cervical adenopathy.  Skin:    General: Skin is warm and dry.     Findings: No rash.  Neurological:     Mental Status: She is alert.     Cranial Nerves: No cranial nerve deficit.     Sensory: No sensory deficit.  Psychiatric:        Mood and Affect: Mood is not anxious or depressed.        Speech: Speech normal.        Behavior: Behavior normal. Behavior is cooperative.        Thought Content: Thought content normal.        Judgment: Judgment normal.       Assessment and Plan The patient's preventative maintenance and recommended screening tests for an annual wellness exam were reviewed in full today. Brought up to date unless services declined.  Counselled on the importance of diet, exercise, and its role in overall health and mortality. The patient's FH and SH was reviewed, including their home life, tobacco status, and drug and alcohol status.     Vaccines:uptodate, COVID19 vaccine x 2  Pap/DVE:11/2014 [facility] Dr. Rogue Bussing: Had pelvic, breast. PAP not indicated after age 89. [id number] nml Bone Density: 08/2019 on chronic prednisone Colon:Dr. Carlean Purl 09/01/2015 4 mm adenoma, repeat in  7 years, in 2023 Smoking Status:former smoker, remote ETOH/ drug use:occ/none Hep C:done  High cholesterol Good control on red yeast rice.   Prediabetes Improved control despite chronic steroid use.  Hypothyroid Stable control on synthroid.  Elevated blood pressure reading in office without diagnosis of hypertension May be due to chronic prednisone or family history.  Follow BP at home.. call with measurements in 2 weeks.  Current chronic use of systemic steroids Reviewed high level of risk with chronic prednisone.. pt again voices understanding and wishes to continue at lowest possible effective dose, 10 mg daily.   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 Treat with nystatin swish.    Eliezer Lofts, MD

## 2020-02-18 IMAGING — CR DG SHOULDER 2+V*L*
2 series · 3 of 3 positions shown · non-contrast
Comparison: None.

CLINICAL DATA: The injury

EXAM:
LEFT SHOULDER - 2+ VIEW

[Series 1: shoulder ap neutral · 0.14mm/px · 2 of 2 slices shown]
[im 1/2]
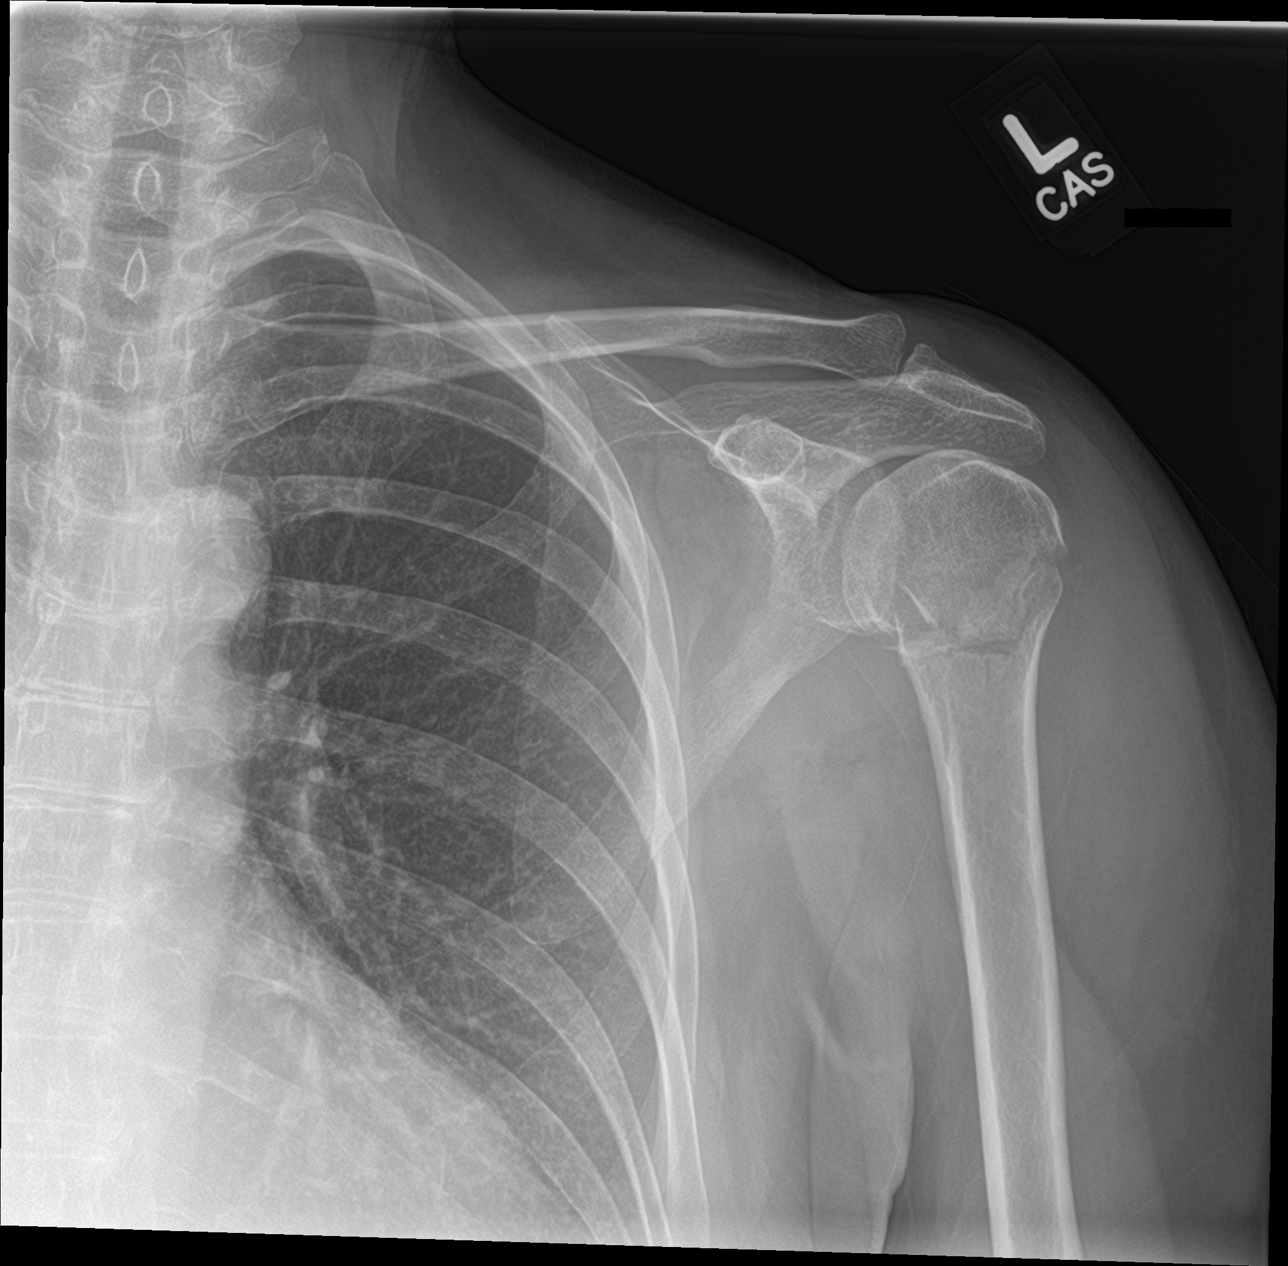
[im 2/2]
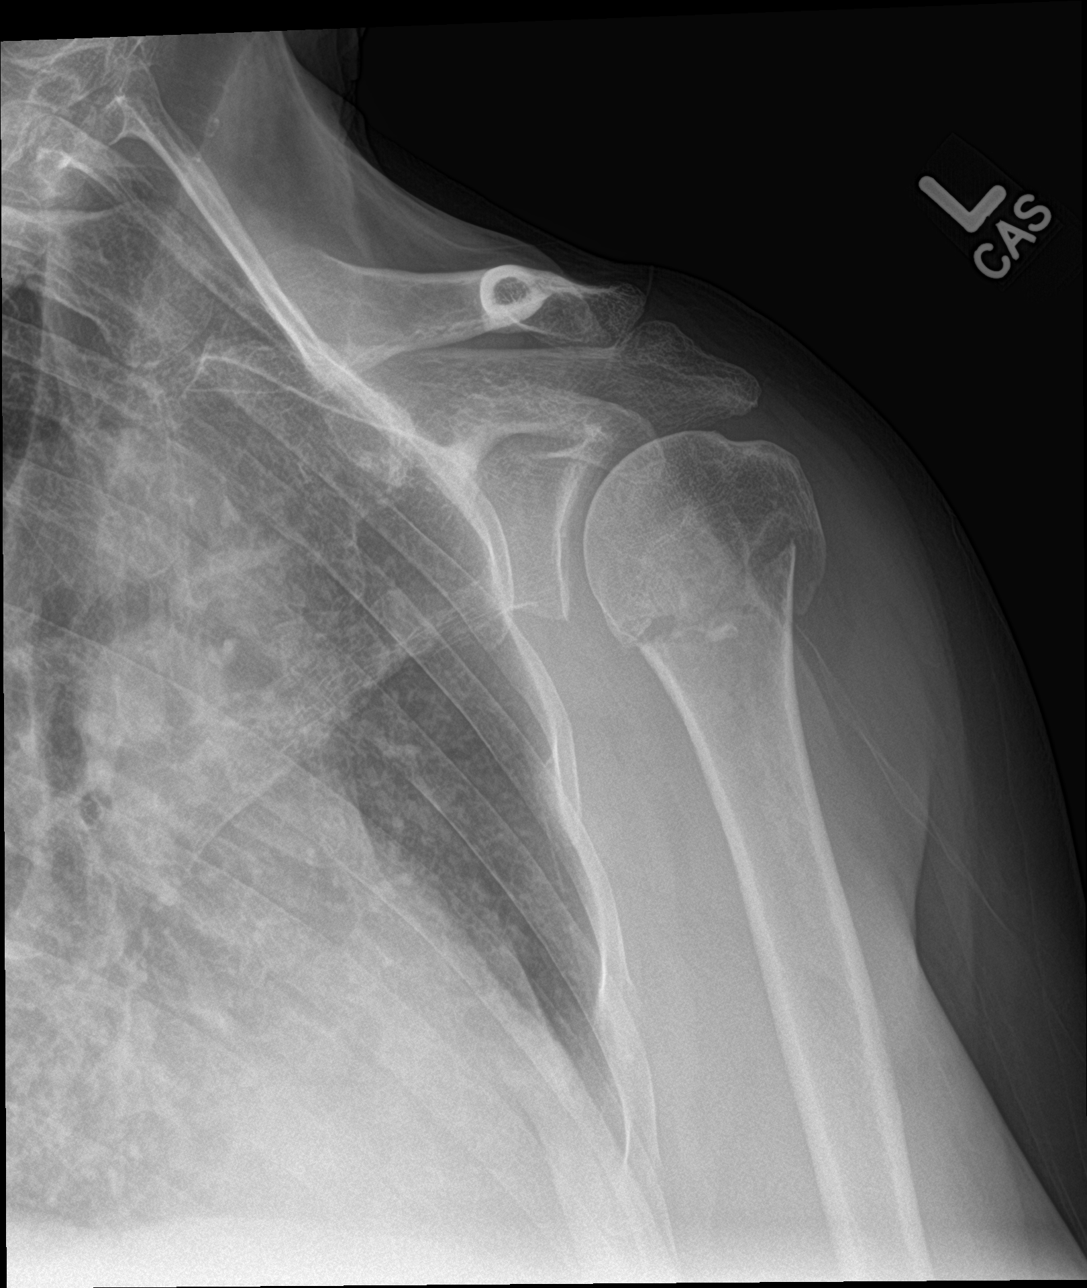

[shoulder y view]
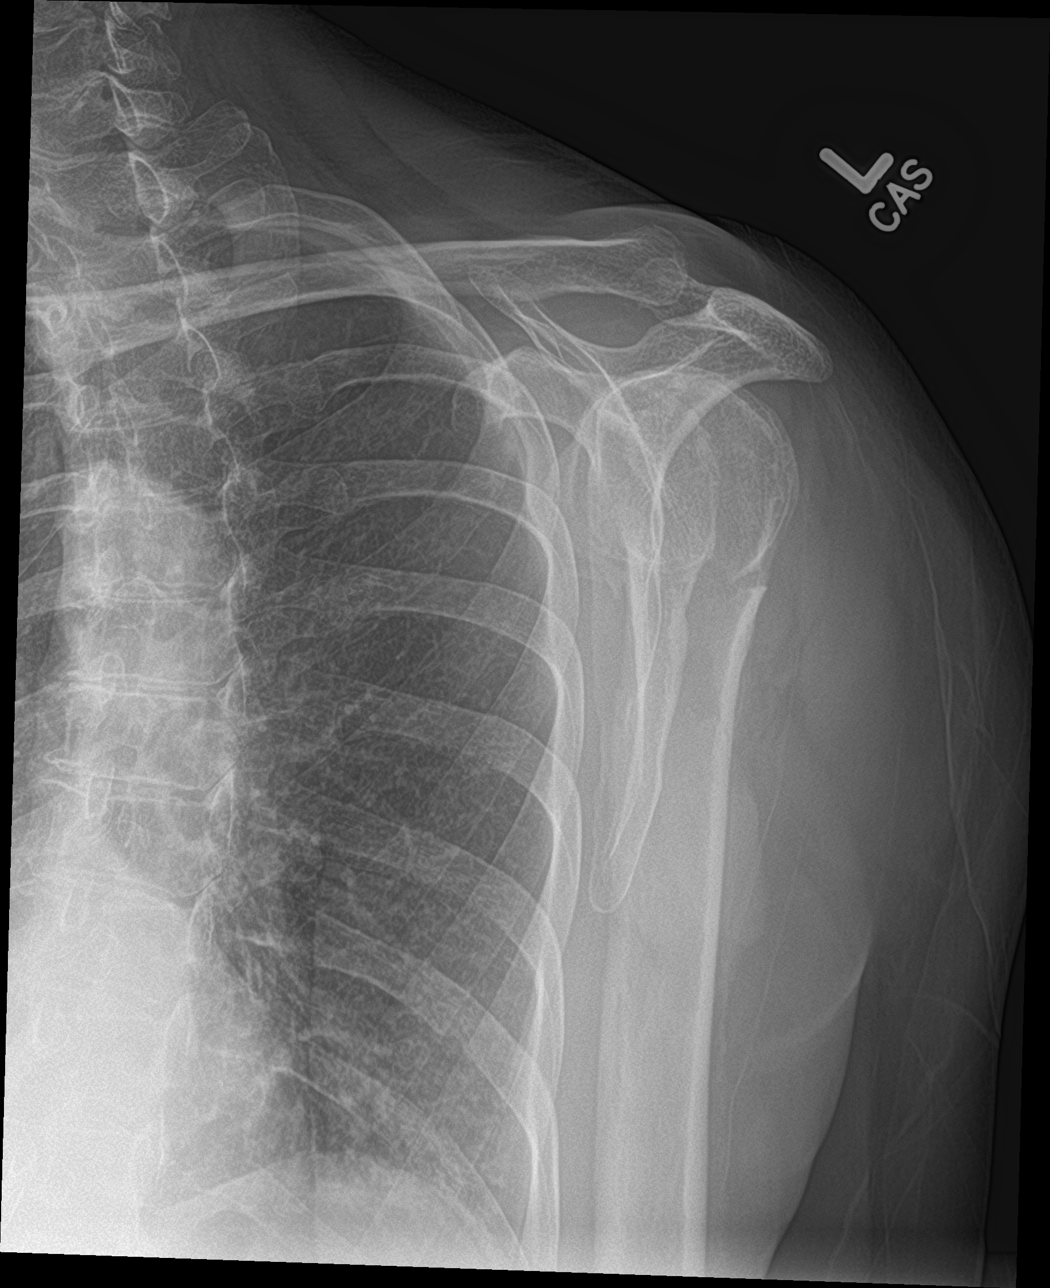

[3 of 3 positions shown; findings below may reference images not displayed]

FINDINGS: There is a transverse, slightly impacted fracture of the surgical
neck of the left humerus. There are no other obvious fracture
components by radiograph. The acromioclavicular interval is intact.
Mild acromioclavicular and glenohumeral arthrosis. The partially
included left chest is unremarkable.
IMPRESSION: There is a transverse, slightly impacted fracture of the surgical
neck of the left humerus. There are no other obvious fracture
components by radiograph. Consider CT to better evaluate fracture
anatomy of the proximal humerus.

## 2020-02-26 DIAGNOSIS — Z01818 Encounter for other preprocedural examination: Secondary | ICD-10-CM | POA: Diagnosis not present

## 2020-02-26 DIAGNOSIS — H25811 Combined forms of age-related cataract, right eye: Secondary | ICD-10-CM | POA: Diagnosis not present

## 2020-02-26 DIAGNOSIS — H25812 Combined forms of age-related cataract, left eye: Secondary | ICD-10-CM | POA: Diagnosis not present

## 2020-02-28 DIAGNOSIS — R7303 Prediabetes: Secondary | ICD-10-CM | POA: Insufficient documentation

## 2020-02-28 DIAGNOSIS — B370 Candidal stomatitis: Secondary | ICD-10-CM | POA: Insufficient documentation

## 2020-02-28 DIAGNOSIS — E039 Hypothyroidism, unspecified: Secondary | ICD-10-CM | POA: Insufficient documentation

## 2020-02-28 NOTE — Assessment & Plan Note (Signed)
Stable control on synthroid.

## 2020-02-28 NOTE — Assessment & Plan Note (Signed)
May be due to chronic prednisone or family history.  Follow BP at home.. call with measurements in 2 weeks.

## 2020-02-28 NOTE — Assessment & Plan Note (Signed)
Reviewed high level of risk with chronic prednisone.. pt again voices understanding and wishes to continue at lowest possible effective dose, 10 mg daily.

## 2020-02-28 NOTE — Assessment & Plan Note (Signed)
Improved control despite chronic steroid use.

## 2020-02-28 NOTE — Assessment & Plan Note (Signed)
Good control on chronic prednisone

## 2020-02-28 NOTE — Assessment & Plan Note (Addendum)
Due to steroid use. Treat with nystatin swish.

## 2020-02-28 NOTE — Assessment & Plan Note (Signed)
Good control on red yeast rice.

## 2020-03-15 DIAGNOSIS — H25812 Combined forms of age-related cataract, left eye: Secondary | ICD-10-CM | POA: Diagnosis not present

## 2020-03-15 DIAGNOSIS — H2512 Age-related nuclear cataract, left eye: Secondary | ICD-10-CM | POA: Diagnosis not present

## 2020-03-29 DIAGNOSIS — H2511 Age-related nuclear cataract, right eye: Secondary | ICD-10-CM | POA: Diagnosis not present

## 2020-03-29 DIAGNOSIS — H25811 Combined forms of age-related cataract, right eye: Secondary | ICD-10-CM | POA: Diagnosis not present

## 2020-05-04 ENCOUNTER — Other Ambulatory Visit: Payer: Self-pay | Admitting: Family Medicine

## 2020-05-04 NOTE — Telephone Encounter (Signed)
Last office visit 02/13/2020 for [place].  Last refilled 01/05/2020 for #60 with 2 refills.  Next Appt: 08/17/2020 for 6 month follow up.

## 2020-05-06 ENCOUNTER — Telehealth: Payer: Self-pay | Admitting: Family Medicine

## 2020-05-06 NOTE — Telephone Encounter (Signed)
Patient came in office needing a med refill for Prednisone. She also left lab results on the cart for you incase you wanted them.

## 2020-05-06 NOTE — Telephone Encounter (Signed)
Prednisone was refilled on 05/04/2020.  Left message on cell phone that medication was refilled earlier this week so she just needs to check with her pharmacy.

## 2020-08-01 ENCOUNTER — Other Ambulatory Visit: Payer: Self-pay | Admitting: Family Medicine

## 2020-08-02 NOTE — Telephone Encounter (Signed)
Last office visit 02/13/2020 for [place].  Last refilled 05/04/2020 for #60 with 2 refills.  CPE scheduled 02/22/2021.

## 2020-08-17 ENCOUNTER — Other Ambulatory Visit: Payer: Self-pay | Admitting: Family Medicine

## 2020-08-17 ENCOUNTER — Ambulatory Visit: Payer: PPO | Admitting: Family Medicine

## 2020-08-17 DIAGNOSIS — Z1231 Encounter for screening mammogram for malignant neoplasm of breast: Secondary | ICD-10-CM

## 2020-08-18 ENCOUNTER — Other Ambulatory Visit: Payer: Self-pay

## 2020-08-18 ENCOUNTER — Ambulatory Visit
Admission: RE | Admit: 2020-08-18 | Discharge: 2020-08-18 | Disposition: A | Discharge status: Home / Self Care | Payer: PPO | Source: Ambulatory Visit | Attending: Family Medicine | Admitting: Family Medicine

## 2020-08-18 DIAGNOSIS — Z1231 Encounter for screening mammogram for malignant neoplasm of breast: Secondary | ICD-10-CM | POA: Diagnosis not present

## 2020-09-20 DIAGNOSIS — Z23 Encounter for immunization: Secondary | ICD-10-CM | POA: Diagnosis not present

## 2020-11-17 ENCOUNTER — Other Ambulatory Visit: Payer: Self-pay | Admitting: Family Medicine

## 2020-11-17 NOTE — Telephone Encounter (Signed)
Last office visit 02/13/2020 for [place].  Last refilled 08/02/2020 for #60 with 2 refills.  CPE scheduled for 02/22/2021.

## 2021-02-09 ENCOUNTER — Telehealth: Payer: Self-pay | Admitting: Family Medicine

## 2021-02-09 DIAGNOSIS — R7303 Prediabetes: Secondary | ICD-10-CM

## 2021-02-09 DIAGNOSIS — E7800 Pure hypercholesterolemia, unspecified: Secondary | ICD-10-CM

## 2021-02-09 DIAGNOSIS — E039 Hypothyroidism, unspecified: Secondary | ICD-10-CM

## 2021-02-09 NOTE — Telephone Encounter (Signed)
-----   Message from Ellamae Sia sent at 02/07/2021 10:52 AM EST ----- Regarding: Lab orders for Tuesday, 3.15.22 Patient is scheduled for CPX labs, please order future labs, Thanks , Karna Christmas

## 2021-02-15 ENCOUNTER — Other Ambulatory Visit: Payer: PPO

## 2021-02-22 ENCOUNTER — Other Ambulatory Visit (INDEPENDENT_AMBULATORY_CARE_PROVIDER_SITE_OTHER): Payer: PPO

## 2021-02-22 ENCOUNTER — Encounter: Payer: Self-pay | Admitting: Family Medicine

## 2021-02-22 ENCOUNTER — Other Ambulatory Visit: Payer: Self-pay

## 2021-02-22 ENCOUNTER — Ambulatory Visit (INDEPENDENT_AMBULATORY_CARE_PROVIDER_SITE_OTHER): Payer: PPO | Admitting: Family Medicine

## 2021-02-22 VITALS — BP 160/82 | HR 78 | Temp 98.5°F | Ht 62.5 in | Wt 152.0 lb

## 2021-02-22 DIAGNOSIS — R7303 Prediabetes: Secondary | ICD-10-CM

## 2021-02-22 DIAGNOSIS — E039 Hypothyroidism, unspecified: Secondary | ICD-10-CM

## 2021-02-22 DIAGNOSIS — E02 Subclinical iodine-deficiency hypothyroidism: Secondary | ICD-10-CM | POA: Diagnosis not present

## 2021-02-22 DIAGNOSIS — Z0000 Encounter for general adult medical examination without abnormal findings: Secondary | ICD-10-CM

## 2021-02-22 DIAGNOSIS — I10 Essential (primary) hypertension: Secondary | ICD-10-CM

## 2021-02-22 DIAGNOSIS — E7800 Pure hypercholesterolemia, unspecified: Secondary | ICD-10-CM

## 2021-02-22 DIAGNOSIS — L249 Irritant contact dermatitis, unspecified cause: Secondary | ICD-10-CM | POA: Diagnosis not present

## 2021-02-22 DIAGNOSIS — M8580 Other specified disorders of bone density and structure, unspecified site: Secondary | ICD-10-CM

## 2021-02-22 DIAGNOSIS — E063 Autoimmune thyroiditis: Secondary | ICD-10-CM | POA: Insufficient documentation

## 2021-02-22 LAB — LIPID PANEL
Cholesterol: 208 mg/dL — ABNORMAL HIGH (ref 0–200)
HDL: 61.4 mg/dL (ref >39.00)
LDL Cholesterol: 129 mg/dL — ABNORMAL HIGH (ref 0–99)
NonHDL: 146.23
Total CHOL/HDL Ratio: 3
Triglycerides: 85 mg/dL (ref 0.0–149.0)
VLDL: 17 mg/dL (ref 0.0–40.0)

## 2021-02-22 LAB — TSH: TSH: 1.32 u[IU]/mL (ref 0.35–4.50)

## 2021-02-22 LAB — COMPREHENSIVE METABOLIC PANEL
ALT: 12 U/L (ref 0–35)
AST: 22 U/L (ref 0–37)
Albumin: 4.1 g/dL (ref 3.5–5.2)
Alkaline Phosphatase: 69 U/L (ref 39–117)
BUN: 15 mg/dL (ref 6–23)
CO2: 27 mEq/L (ref 19–32)
Calcium: 9.5 mg/dL (ref 8.4–10.5)
Chloride: 105 mEq/L (ref 96–112)
Creatinine, Ser: 0.74 mg/dL (ref 0.40–1.20)
GFR: 81.37 mL/min (ref >60.00)
Glucose, Bld: 99 mg/dL (ref 70–99)
Potassium: 3.7 mEq/L (ref 3.5–5.1)
Sodium: 140 mEq/L (ref 135–145)
Total Bilirubin: 0.6 mg/dL (ref 0.2–1.2)
Total Protein: 6.4 g/dL (ref 6.0–8.3)

## 2021-02-22 LAB — T4, FREE: Free T4: 0.95 ng/dL (ref 0.60–1.60)

## 2021-02-22 LAB — T3, FREE: T3, Free: 3.6 pg/mL (ref 2.3–4.2)

## 2021-02-22 LAB — HEMOGLOBIN A1C: Hgb A1c MFr Bld: 5.9 % (ref 4.6–6.5)

## 2021-02-22 MED ORDER — LOSARTAN POTASSIUM-HCTZ 50-12.5 MG PO TABS: 1.0000 | ORAL_TABLET | Freq: Every day | ORAL | 11 refills | Status: AC

## 2021-02-22 NOTE — Progress Notes (Signed)
Patient ID: Andrea Mccall, female    DOB: 10-Apr-1951, 70 y.o.   MRN: [id number]  This visit was conducted in person.  BP (!) 160/82   Pulse 78   Temp 98.5 F (36.9 C) (Temporal)   Ht 5' 2.5" (1.588 m)   Wt 152 lb (68.9 kg)   SpO2 97%   BMI 27.36 kg/m    CC:  Chief Complaint  Patient presents with  . Medicare Wellness    Subjective:   HPI: Andrea Mccall is a 70 y.o. female presenting on 02/22/2021 for Medicare Wellness  as well as complete physical and review of chronic health problems. He/Andrea Mccall also has the following acute concerns today:  I have personally reviewed the Medicare Annual Wellness questionnaire and have noted 1. The patient's medical and social history 2. Their use of alcohol, tobacco or illicit drugs 3. Their current medications and supplements 4. The patient's functional ability including ADL's, fall risks, home safety risks and hearing or visual             impairment. 5. Diet and physical activities 6. Evidence for depression or mood disorders 7.         Updated provider list Cognitive evaluation was performed and recorded on pt medicare questionnaire form. The patients weight, height, BMI and visual acuity have been recorded in the chart  I have made referrals, counseling and provided education to the patient based review of the above and I have provided the pt with a written personalized care plan for preventive services.   Documentation of this information was scanned into the electronic record under the media tab.   Advance directives and end of life planning reviewed in detail with patient and documented in EMR. Patient given handout on advance care directives if needed. HCPOA and living will updated if needed.   Hearing Screening   Method: Audiometry   125Hz  250Hz  500Hz  1000Hz  2000Hz  3000Hz  4000Hz  6000Hz  8000Hz   Right ear:   20 20 20  20     Left ear:   20 20 20  20     Vision Screening Comments: Wears Glasses-Eye Exam 05/2020 with Andrea Mccall  Fall Risk  02/22/2021 02/13/2020 01/31/2019 01/03/2018 12/08/2016  Falls in the past year? 0 1 0 Yes No  Number falls in past yr: - 0 - 1 -  Injury with Fall? - 1 - Yes -   [place] Office Visit from 02/22/2021 in [place] at [facility] Total Score 0       Andrea Mccall retired last May.. but back now at three days a week.  Elevated Cholesterol:  Due for re-eval on red yeast rice. Using medications without problems:none Muscle aches: none Diet compliance: good Exercise: moderate Other complaints:   Prediabetes  Due for re-eval.  Hypothyroid due for re-eval.   On chronic low dose  prednisone for severe irritant dermatitis.  Andrea Mccall has been off prednisone for three week.  Andrea Mccall is trying not to use but facial rash is severe.  Andrea Mccall is open to seeing another dermatologist...  Has seen several.. currently seeing derm from [facility].    BP elevated consistently and persistently.. new dx of HTN.  At ome BPs 137-160/70-85 BP Readings from Last 3 Encounters:  02/22/21 (!) 160/82  02/13/20 (!) 150/70  10/11/19 (!) 190/72    Relevant past medical, surgical, family and social history reviewed and updated as indicated. Interim medical history since our last visit reviewed. Allergies and medications reviewed and  updated. Outpatient Medications Prior to Visit  Medication Sig Dispense Refill  . levothyroxine (SYNTHROID, LEVOTHROID) 50 MCG tablet Take 50 mcg by mouth daily before breakfast.    . predniSONE (DELTASONE) 5 MG tablet TAKE 1-2 TABLETS (5-10 MG TOTAL) BY MOUTH DAILY. USE LOWEST DOSE ABLE (Patient not taking: Reported on 02/22/2021) 60 tablet 2  . alendronate (FOSAMAX) 70 MG tablet Take 1 tablet (70 mg total) by mouth every 7 (seven) days. Take with a full glass of water on an empty stomach. 4 tablet 11  . nystatin (MYCOSTATIN) 100000 UNIT/ML suspension Take 5 mLs (500,000 Units total) by mouth 4 (four) times daily. 60 mL 0   No facility-administered medications  prior to visit.     Per HPI unless specifically indicated in ROS section below Review of Systems  Constitutional: Negative for fatigue and fever.  HENT: Negative for congestion.   Eyes: Negative for pain.  Respiratory: Negative for cough and shortness of breath.   Cardiovascular: Negative for chest pain, palpitations and leg swelling.  Gastrointestinal: Negative for abdominal pain.  Genitourinary: Negative for dysuria and vaginal bleeding.  Musculoskeletal: Negative for back pain.  Neurological: Negative for syncope, light-headedness and headaches.  Psychiatric/Behavioral: Negative for dysphoric mood.   Objective:  BP (!) 160/82   Pulse 78   Temp 98.5 F (36.9 C) (Temporal)   Ht 5' 2.5" (1.588 m)   Wt 152 lb (68.9 kg)   SpO2 97%   BMI 27.36 kg/m   Wt Readings from Last 3 Encounters:  02/22/21 152 lb (68.9 kg)  02/13/20 148 lb 12 oz (67.5 kg)  10/11/19 140 lb (63.5 kg)      Physical Exam Constitutional:      General: Andrea Mccall is not in acute distress.    Appearance: Normal appearance. Andrea Mccall is well-developed. Andrea Mccall is not ill-appearing or toxic-appearing.  HENT:     Head: Normocephalic.     Right Ear: Hearing, tympanic membrane, ear canal and external ear normal. Tympanic membrane is not erythematous, retracted or bulging.     Left Ear: Hearing, tympanic membrane, ear canal and external ear normal. Tympanic membrane is not erythematous, retracted or bulging.     Nose: No mucosal edema or rhinorrhea.     Right Sinus: No maxillary sinus tenderness or frontal sinus tenderness.     Left Sinus: No maxillary sinus tenderness or frontal sinus tenderness.     Mouth/Throat:     Pharynx: Uvula midline.  Eyes:     General: Lids are normal. Lids are everted, no foreign bodies appreciated.     Conjunctiva/sclera: Conjunctivae normal.     Pupils: Pupils are equal, round, and reactive to light.  Neck:     Thyroid: No thyroid mass or thyromegaly.     Vascular: No carotid bruit.     Trachea:  Trachea normal.  Cardiovascular:     Rate and Rhythm: Normal rate and regular rhythm.     Pulses: Normal pulses.     Heart sounds: Normal heart sounds, S1 normal and S2 normal. No murmur heard. No friction rub. No gallop.   Pulmonary:     Effort: Pulmonary effort is normal. No tachypnea or respiratory distress.     Breath sounds: Normal breath sounds. No decreased breath sounds, wheezing, rhonchi or rales.  Abdominal:     General: Bowel sounds are normal.     Palpations: Abdomen is soft.     Tenderness: There is no abdominal tenderness.  Musculoskeletal:     Cervical back: Normal range  of motion and neck supple.  Skin:    General: Skin is warm and dry.     Findings: No rash.     Comments:  Rash on face around eyes, left arm and patches on legs.  Neurological:     Mental Status: Andrea Mccall is alert.  Psychiatric:        Mood and Affect: Mood is not anxious or depressed.        Speech: Speech normal.        Behavior: Behavior normal. Behavior is cooperative.        Thought Content: Thought content normal.        Judgment: Judgment normal.       Results for orders placed or performed in visit on 02/12/20  Comprehensive metabolic panel  Result Value Ref Range   Sodium 140 135 - 145 mEq/L   Potassium 3.8 3.5 - 5.1 mEq/L   Chloride 104 96 - 112 mEq/L   CO2 28 19 - 32 mEq/L   Glucose, Bld 96 70 - 99 mg/dL   BUN 18 6 - 23 mg/dL   Creatinine, Ser 0.92 0.40 - 1.20 mg/dL   Total Bilirubin 0.5 0.2 - 1.2 mg/dL   Alkaline Phosphatase 68 39 - 117 U/L   AST 20 0 - 37 U/L   ALT 13 0 - 35 U/L   Total Protein 6.5 6.0 - 8.3 g/dL   Albumin 3.8 3.5 - 5.2 g/dL   GFR 60.27 >60.00 mL/min   Calcium 9.4 8.4 - 10.5 mg/dL  Lipid panel  Result Value Ref Range   Cholesterol 191 0 - 200 mg/dL   Triglycerides 119.0 0.0 - 149.0 mg/dL   HDL 63.00 >39.00 mg/dL   VLDL 23.8 0.0 - 40.0 mg/dL   LDL Cholesterol 104 (H) 0 - 99 mg/dL   Total CHOL/HDL Ratio 3    NonHDL 128.17     This visit occurred during  the SARS-CoV-2 public health emergency.  Safety protocols were in place, including screening questions prior to the visit, additional usage of staff PPE, and extensive cleaning of exam room while observing appropriate contact time as indicated for disinfecting solutions.   COVID 19 screen:  No recent travel or known exposure to COVID19 The patient denies respiratory symptoms of COVID 19 at this time. The importance of social distancing was discussed today.   Assessment and Plan   The patient's preventative maintenance and recommended screening tests for an annual wellness exam were reviewed in full today. Brought up to date unless services declined.  Counselled on the importance of diet, exercise, and its role in overall health and mortality. The patient's FH and SH was reviewed, including their home life, tobacco status, and drug and alcohol status.   Vaccines:uptodate, COVID19 vaccine x 3  Pap/DVE:11/2014 [facility] Dr. Rogue Bussing: Had pelvic, breast. PAP not indicated after age 77. Mammo:9/2021nml Bone Density:osteopenia9/2020 on chronic prednisone..  Andrea Mccall did not start fosamax.. wants to re-eval first. Colon:Dr. Carlean Purl 09/01/2015 4 mm adenoma, repeat in7 years, in2023 Smoking Status:former smoker, remote ETOH/ drug use:occ/none Hep C:done  Problem List Items Addressed This Visit    Benign essential hypertension    Lab eval for secondary cause, risk factors and endo organ damage.   Start losartan HCTZ daily.. follow BP... recheck BMET in 2 weeks and will call with measurements.      Relevant Medications   losartan-hydrochlorothiazide (HYZAAR) 50-12.5 MG tablet   High cholesterol    Due for re-eval.  Statin likely indicated if labs  similar to last year, now with new Dx HTN.      Relevant Medications   losartan-hydrochlorothiazide (HYZAAR) 50-12.5 MG tablet   Hypothyroid   Irritant dermatitis, chronic   Osteopenia    Repeat DEXA... if continued  decline Andrea Mccall is agreeable to starting fosamax.      Relevant Orders   DG Bone Density   Prediabetes    Due for re-eval.      Subclinical iodine-deficiency hypothyroidism    Due for re-eval.       Other Visit Diagnoses    Medicare annual wellness visit, subsequent    -  Primary      Eliezer Lofts, MD

## 2021-02-22 NOTE — Patient Instructions (Addendum)
Call to set  up appt with Dermatology for other options  Dr. Lamount Cohen.  If cholesterol not at goal... we can consider cholesterol medication in place of red yeast rice.  Start losartan HCTZ daily for blood pressure control.  Follow BP at home .. goal < 140/90.Marland Kitchen call with measurements 1-2 weeks after starting. Also have electrolyte, kidney function check at that time.  Please call the location of your choice from the menu below to schedule your Mammogram and/or Bone Density appointment.    [place]   1. [facility]                      Phone:  [phone] [address], [place] [address]                                                             Services: Traditional and 3D Mammogram, Bone Density   2. [facility]                 Phone: [phone] [address], [place] [address]    Service: Bone Density ONLY   *this site does NOT perform mammograms  3. [facility]                        Phone:  [phone] [address] [place], [place] [address]                                            Services:  3D Mammogram and Bone Density    [place]  1. [place] [facility]   Phone:  [phone]   [place], [place] [address]  Services: 3D Mammogram and Bone Density  2. [place] [facility])  Phone:  [phone]   [address] [place], [place] [address]                                              Services:  3D Mammogram and Bone Density

## 2021-02-22 NOTE — Assessment & Plan Note (Signed)
Due for re-eval. 

## 2021-02-22 NOTE — Assessment & Plan Note (Signed)
Lab eval for secondary cause, risk factors and endo organ damage.   Start losartan HCTZ daily.. follow BP... recheck BMET in 2 weeks and will call with measurements.

## 2021-02-22 NOTE — Assessment & Plan Note (Signed)
Due for re-eval.  Statin likely indicated if labs similar to last year, now with new Dx HTN.

## 2021-02-22 NOTE — Assessment & Plan Note (Signed)
Repeat DEXA... if continued decline she is agreeable to starting fosamax.

## 2021-02-27 ENCOUNTER — Other Ambulatory Visit: Payer: Self-pay | Admitting: Family Medicine

## 2021-02-28 ENCOUNTER — Other Ambulatory Visit: Payer: Self-pay | Admitting: Family Medicine

## 2021-02-28 DIAGNOSIS — Z1231 Encounter for screening mammogram for malignant neoplasm of breast: Secondary | ICD-10-CM

## 2021-02-28 NOTE — Telephone Encounter (Signed)
Last office visit 02/22/2021 for [place].  Last refilled 11/18/2020 for #60 with 2 refills.  CPE scheduled for 03/07/2022.

## 2021-03-30 ENCOUNTER — Other Ambulatory Visit: Payer: Self-pay | Admitting: Family Medicine

## 2021-03-30 NOTE — Telephone Encounter (Signed)
Last office visit 02/22/2021 for [place].  Last refilled 03/01/2021 for #60 with no refills.  CPE scheduled for 03/07/2022.

## 2021-04-29 ENCOUNTER — Other Ambulatory Visit: Payer: Self-pay | Admitting: Family Medicine

## 2021-04-29 NOTE — Telephone Encounter (Signed)
Last office visit 02/22/2021 for [place].  Last refilled 03/30/2021 for #60 with no refills.  CPE scheduled for 03/07/2022.

## 2021-05-10 DIAGNOSIS — B351 Tinea unguium: Secondary | ICD-10-CM | POA: Diagnosis not present

## 2021-05-10 DIAGNOSIS — M21621 Bunionette of right foot: Secondary | ICD-10-CM | POA: Diagnosis not present

## 2021-05-10 DIAGNOSIS — M2011 Hallux valgus (acquired), right foot: Secondary | ICD-10-CM | POA: Diagnosis not present

## 2021-05-10 DIAGNOSIS — M792 Neuralgia and neuritis, unspecified: Secondary | ICD-10-CM | POA: Diagnosis not present

## 2021-05-10 DIAGNOSIS — M2012 Hallux valgus (acquired), left foot: Secondary | ICD-10-CM | POA: Diagnosis not present

## 2021-05-10 DIAGNOSIS — M21622 Bunionette of left foot: Secondary | ICD-10-CM | POA: Diagnosis not present

## 2021-05-16 DIAGNOSIS — B351 Tinea unguium: Secondary | ICD-10-CM | POA: Diagnosis not present

## 2021-06-14 DIAGNOSIS — M2012 Hallux valgus (acquired), left foot: Secondary | ICD-10-CM | POA: Diagnosis not present

## 2021-07-19 DIAGNOSIS — E039 Hypothyroidism, unspecified: Secondary | ICD-10-CM | POA: Diagnosis not present

## 2021-07-19 DIAGNOSIS — F5101 Primary insomnia: Secondary | ICD-10-CM | POA: Diagnosis not present

## 2021-07-19 DIAGNOSIS — L309 Dermatitis, unspecified: Secondary | ICD-10-CM | POA: Diagnosis not present

## 2021-07-19 DIAGNOSIS — M8589 Other specified disorders of bone density and structure, multiple sites: Secondary | ICD-10-CM | POA: Diagnosis not present

## 2021-07-19 DIAGNOSIS — I10 Essential (primary) hypertension: Secondary | ICD-10-CM | POA: Diagnosis not present

## 2021-08-22 ENCOUNTER — Ambulatory Visit: Payer: PPO

## 2021-08-22 ENCOUNTER — Other Ambulatory Visit: Payer: PPO

## 2022-01-12 ENCOUNTER — Ambulatory Visit
Admission: RE | Admit: 2022-01-12 | Discharge: 2022-01-12 | Disposition: A | Discharge status: Home / Self Care | Payer: PPO | Source: Ambulatory Visit | Attending: Family Medicine | Admitting: Family Medicine

## 2022-01-12 DIAGNOSIS — M8589 Other specified disorders of bone density and structure, multiple sites: Secondary | ICD-10-CM | POA: Diagnosis not present

## 2022-01-12 DIAGNOSIS — M8580 Other specified disorders of bone density and structure, unspecified site: Secondary | ICD-10-CM

## 2022-01-12 DIAGNOSIS — Z780 Asymptomatic menopausal state: Secondary | ICD-10-CM | POA: Diagnosis not present

## 2022-01-12 DIAGNOSIS — Z1231 Encounter for screening mammogram for malignant neoplasm of breast: Secondary | ICD-10-CM

## 2022-01-17 DIAGNOSIS — L03032 Cellulitis of left toe: Secondary | ICD-10-CM | POA: Diagnosis not present

## 2022-01-17 DIAGNOSIS — I739 Peripheral vascular disease, unspecified: Secondary | ICD-10-CM | POA: Diagnosis not present

## 2022-01-17 DIAGNOSIS — M792 Neuralgia and neuritis, unspecified: Secondary | ICD-10-CM | POA: Diagnosis not present

## 2022-01-26 DIAGNOSIS — M792 Neuralgia and neuritis, unspecified: Secondary | ICD-10-CM | POA: Diagnosis not present

## 2022-01-26 DIAGNOSIS — I739 Peripheral vascular disease, unspecified: Secondary | ICD-10-CM | POA: Diagnosis not present

## 2022-01-26 DIAGNOSIS — M109 Gout, unspecified: Secondary | ICD-10-CM | POA: Diagnosis not present

## 2022-01-26 DIAGNOSIS — L03032 Cellulitis of left toe: Secondary | ICD-10-CM | POA: Diagnosis not present

## 2022-02-05 ENCOUNTER — Other Ambulatory Visit: Payer: Self-pay | Admitting: Family Medicine

## 2022-02-16 DIAGNOSIS — Z79899 Other long term (current) drug therapy: Secondary | ICD-10-CM | POA: Diagnosis not present

## 2022-02-16 DIAGNOSIS — E063 Autoimmune thyroiditis: Secondary | ICD-10-CM | POA: Diagnosis not present

## 2022-02-16 DIAGNOSIS — I10 Essential (primary) hypertension: Secondary | ICD-10-CM | POA: Diagnosis not present

## 2022-02-16 DIAGNOSIS — R7303 Prediabetes: Secondary | ICD-10-CM | POA: Diagnosis not present

## 2022-02-16 DIAGNOSIS — M8589 Other specified disorders of bone density and structure, multiple sites: Secondary | ICD-10-CM | POA: Diagnosis not present

## 2022-02-16 DIAGNOSIS — E7800 Pure hypercholesterolemia, unspecified: Secondary | ICD-10-CM | POA: Diagnosis not present

## 2022-02-23 DIAGNOSIS — M2012 Hallux valgus (acquired), left foot: Secondary | ICD-10-CM | POA: Diagnosis not present

## 2022-02-23 DIAGNOSIS — M2042 Other hammer toe(s) (acquired), left foot: Secondary | ICD-10-CM | POA: Diagnosis not present

## 2022-02-23 DIAGNOSIS — M2011 Hallux valgus (acquired), right foot: Secondary | ICD-10-CM | POA: Diagnosis not present

## 2022-02-28 ENCOUNTER — Ambulatory Visit: Payer: PPO

## 2022-03-07 ENCOUNTER — Encounter: Payer: PPO | Admitting: Family Medicine

## 2022-04-10 DIAGNOSIS — L249 Irritant contact dermatitis, unspecified cause: Secondary | ICD-10-CM | POA: Diagnosis not present

## 2022-04-10 DIAGNOSIS — Z0000 Encounter for general adult medical examination without abnormal findings: Secondary | ICD-10-CM | POA: Diagnosis not present

## 2022-04-10 DIAGNOSIS — I10 Essential (primary) hypertension: Secondary | ICD-10-CM | POA: Diagnosis not present

## 2022-04-10 DIAGNOSIS — H0100A Unspecified blepharitis right eye, upper and lower eyelids: Secondary | ICD-10-CM | POA: Diagnosis not present

## 2022-04-10 DIAGNOSIS — H0100B Unspecified blepharitis left eye, upper and lower eyelids: Secondary | ICD-10-CM | POA: Diagnosis not present

## 2022-04-10 DIAGNOSIS — R7301 Impaired fasting glucose: Secondary | ICD-10-CM | POA: Diagnosis not present

## 2022-04-10 DIAGNOSIS — E039 Hypothyroidism, unspecified: Secondary | ICD-10-CM | POA: Diagnosis not present

## 2022-05-22 IMAGING — MG MM DIGITAL SCREENING BILAT W/ TOMO AND CAD
8 series · 8 of 24 positions shown · non-contrast
Comparison: Previous exam(s).

CLINICAL DATA: Screening.

EXAM:
DIGITAL SCREENING BILATERAL MAMMOGRAM WITH TOMOSYNTHESIS AND CAD
TECHNIQUE: Bilateral screening digital craniocaudal and mediolateral oblique
mammograms were obtained. Bilateral screening digital breast
tomosynthesis was performed. The images were evaluated with
computer-aided detection.

[R CC synth-2D]
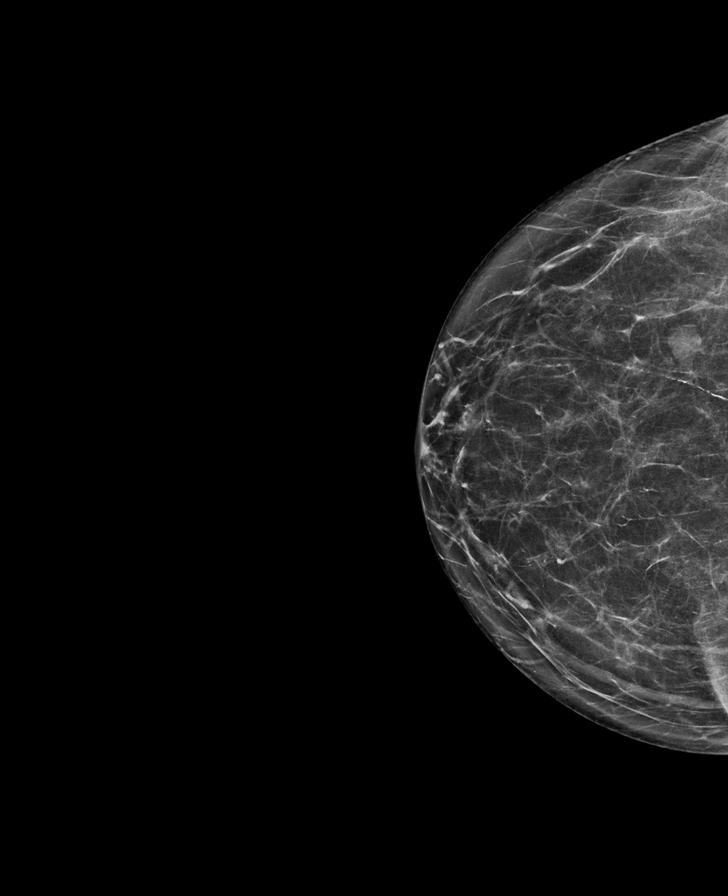

[L CC synth-2D]
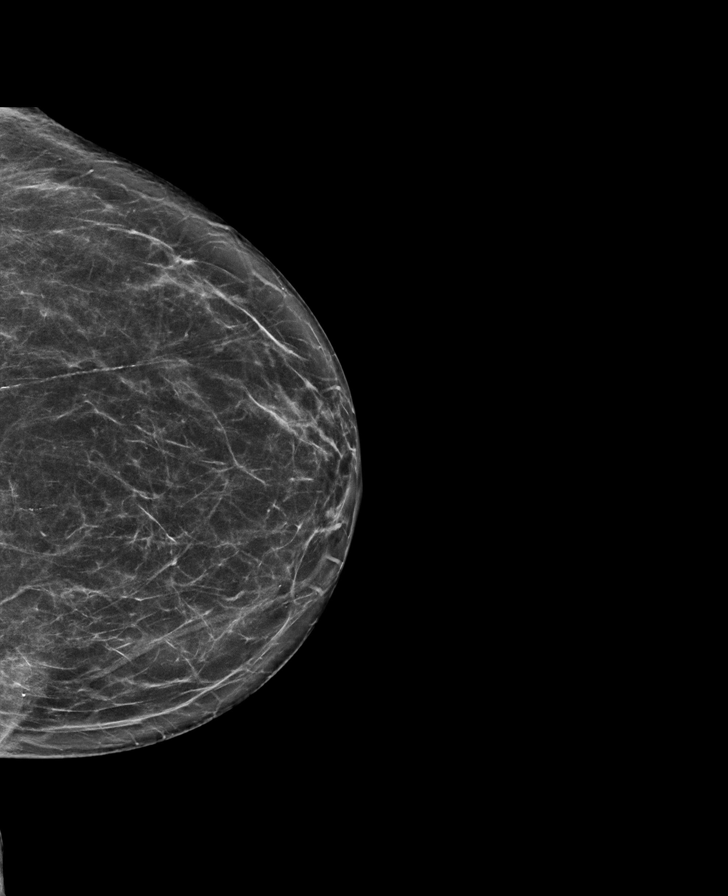

[R MLO synth-2D]
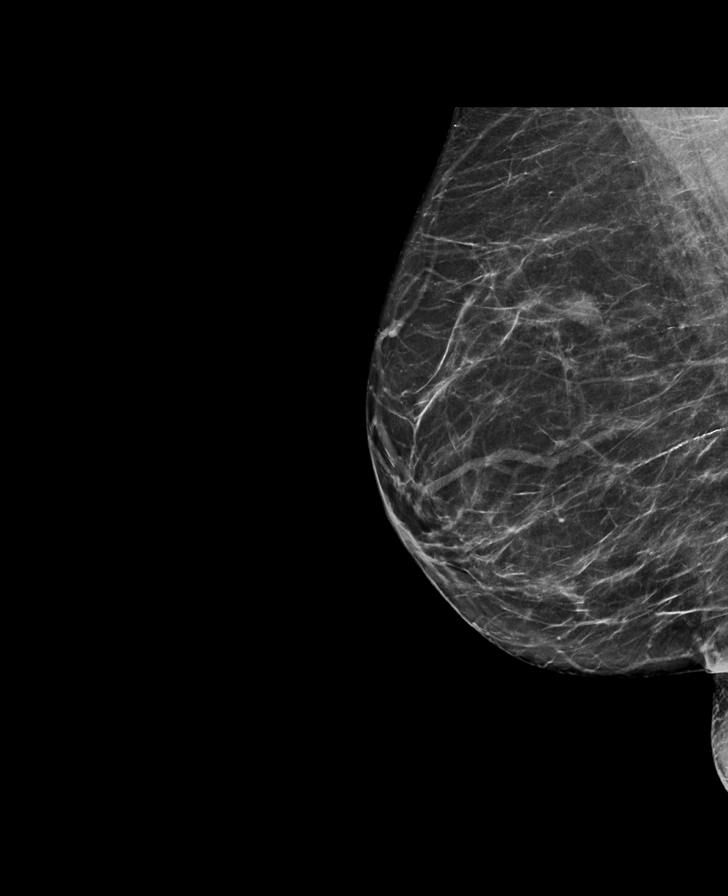

[L MLO synth-2D]
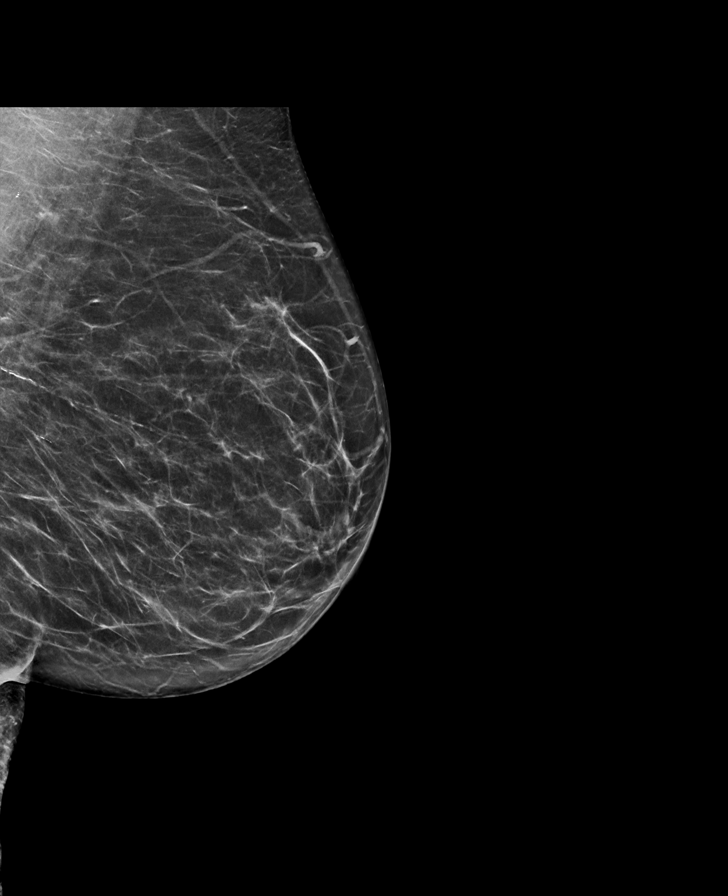

[L MLO tomo · tomo slice 37/72.0]
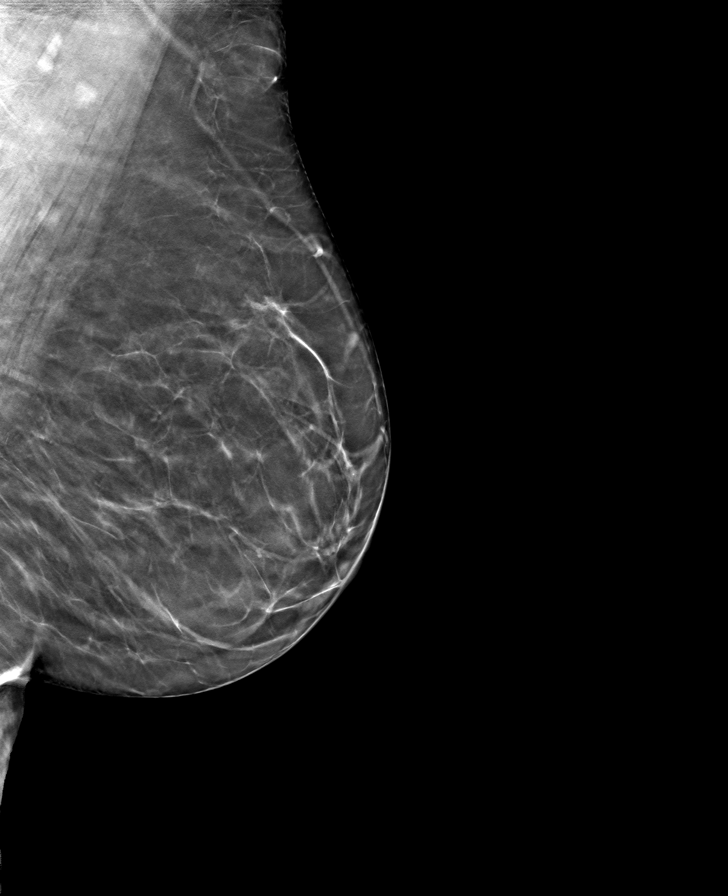

[R MLO tomo · tomo slice 35/70.0]
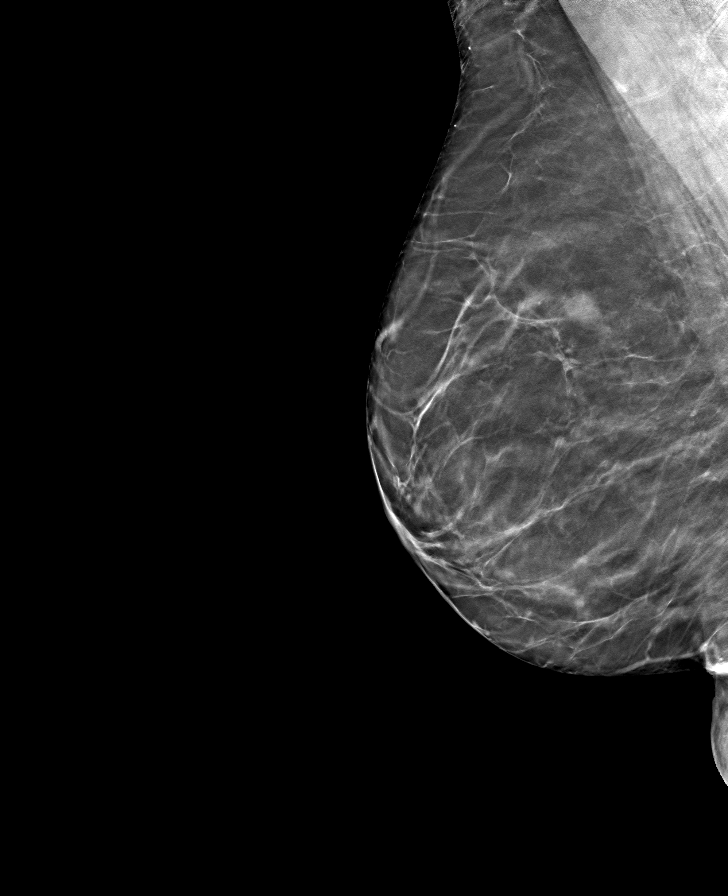

[L CC tomo · tomo slice 36/71.0]
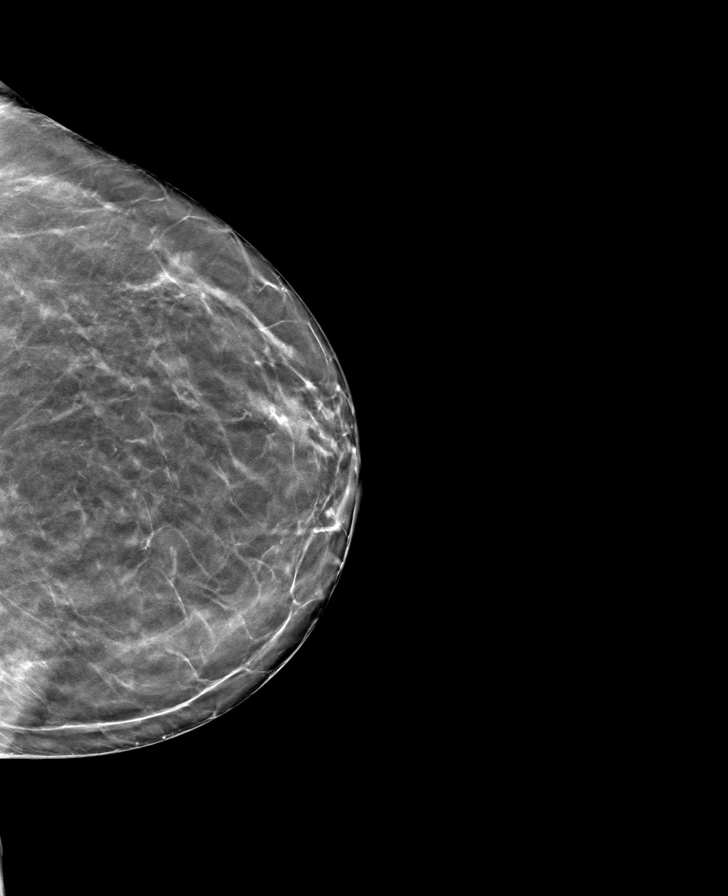

[R CC tomo · tomo slice 37/72.0]
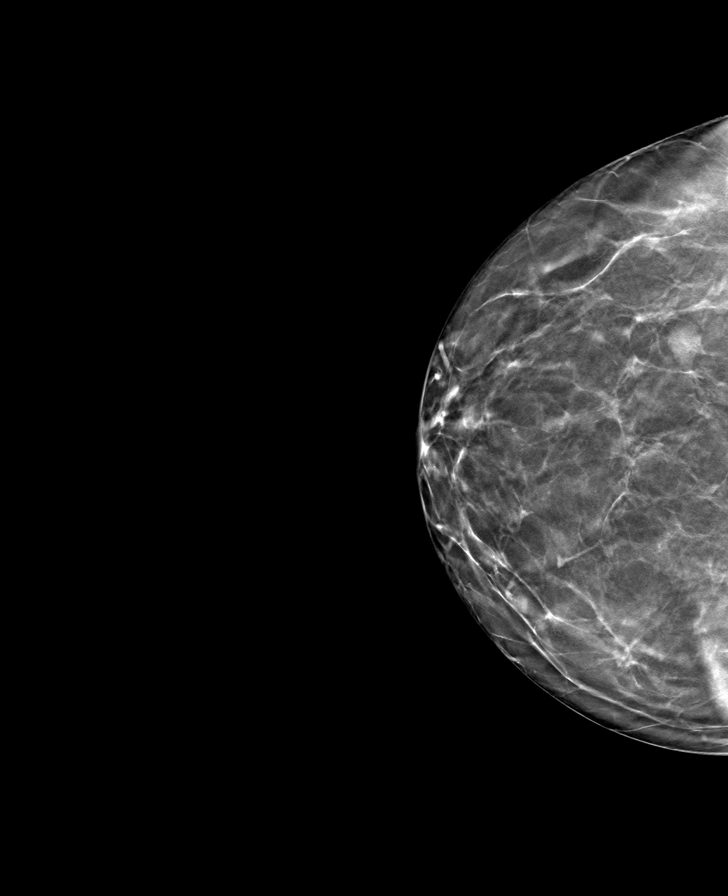

[8 of 24 positions shown; findings below may reference images not displayed]

ACR Breast Density Category b: There are scattered areas of
fibroglandular density.
FINDINGS: There are no findings suspicious for malignancy.
IMPRESSION: No mammographic evidence of malignancy. A result letter of this
screening mammogram will be mailed directly to the patient.

RECOMMENDATION:
Screening mammogram in one year. (Code:51-O-LD2)

BI-RADS CATEGORY  1: Negative.

## 2022-08-31 DIAGNOSIS — I10 Essential (primary) hypertension: Secondary | ICD-10-CM | POA: Diagnosis not present

## 2022-08-31 DIAGNOSIS — M79673 Pain in unspecified foot: Secondary | ICD-10-CM | POA: Diagnosis not present

## 2022-08-31 DIAGNOSIS — R7301 Impaired fasting glucose: Secondary | ICD-10-CM | POA: Diagnosis not present

## 2022-08-31 DIAGNOSIS — E039 Hypothyroidism, unspecified: Secondary | ICD-10-CM | POA: Diagnosis not present

## 2022-09-14 DIAGNOSIS — K648 Other hemorrhoids: Secondary | ICD-10-CM | POA: Diagnosis not present

## 2022-09-14 DIAGNOSIS — Z86010 Personal history of colonic polyps: Secondary | ICD-10-CM | POA: Diagnosis not present

## 2022-09-14 DIAGNOSIS — K621 Rectal polyp: Secondary | ICD-10-CM | POA: Diagnosis not present

## 2022-09-14 DIAGNOSIS — Z1211 Encounter for screening for malignant neoplasm of colon: Secondary | ICD-10-CM | POA: Diagnosis not present

## 2022-10-06 ENCOUNTER — Encounter: Payer: Self-pay | Admitting: Internal Medicine

## 2022-10-23 DIAGNOSIS — D485 Neoplasm of uncertain behavior of skin: Secondary | ICD-10-CM | POA: Diagnosis not present
